# Patient Record
Sex: Female | Born: 1990 | Race: Asian | Hispanic: No | Marital: Married | Smoking: Never smoker
Health system: Southern US, Community
[De-identification: ages and names within clinical notes are randomized; demographics above are authoritative.]

## PROBLEM LIST (undated history)

## (undated) ENCOUNTER — Inpatient Hospital Stay (HOSPITAL_COMMUNITY): Payer: Self-pay

## (undated) DIAGNOSIS — Z789 Other specified health status: Secondary | ICD-10-CM

## (undated) HISTORY — PX: NO PAST SURGERIES: SHX2092

---

## 2017-03-17 DIAGNOSIS — Z0000 Encounter for general adult medical examination without abnormal findings: Secondary | ICD-10-CM | POA: Diagnosis not present

## 2017-03-17 DIAGNOSIS — R718 Other abnormality of red blood cells: Secondary | ICD-10-CM | POA: Diagnosis not present

## 2017-04-18 DIAGNOSIS — N896 Tight hymenal ring: Secondary | ICD-10-CM | POA: Diagnosis not present

## 2017-04-18 DIAGNOSIS — Z01411 Encounter for gynecological examination (general) (routine) with abnormal findings: Secondary | ICD-10-CM | POA: Diagnosis not present

## 2017-04-18 DIAGNOSIS — Z01419 Encounter for gynecological examination (general) (routine) without abnormal findings: Secondary | ICD-10-CM | POA: Diagnosis not present

## 2017-04-18 DIAGNOSIS — Z118 Encounter for screening for other infectious and parasitic diseases: Secondary | ICD-10-CM | POA: Diagnosis not present

## 2017-05-14 DIAGNOSIS — M62838 Other muscle spasm: Secondary | ICD-10-CM | POA: Diagnosis not present

## 2017-05-14 DIAGNOSIS — M6289 Other specified disorders of muscle: Secondary | ICD-10-CM | POA: Diagnosis not present

## 2017-05-14 DIAGNOSIS — M6281 Muscle weakness (generalized): Secondary | ICD-10-CM | POA: Diagnosis not present

## 2017-05-19 DIAGNOSIS — M6281 Muscle weakness (generalized): Secondary | ICD-10-CM | POA: Diagnosis not present

## 2017-05-19 DIAGNOSIS — M6289 Other specified disorders of muscle: Secondary | ICD-10-CM | POA: Diagnosis not present

## 2017-05-19 DIAGNOSIS — M62838 Other muscle spasm: Secondary | ICD-10-CM | POA: Diagnosis not present

## 2017-05-29 DIAGNOSIS — M62838 Other muscle spasm: Secondary | ICD-10-CM | POA: Diagnosis not present

## 2017-05-29 DIAGNOSIS — M6281 Muscle weakness (generalized): Secondary | ICD-10-CM | POA: Diagnosis not present

## 2017-05-29 DIAGNOSIS — M6289 Other specified disorders of muscle: Secondary | ICD-10-CM | POA: Diagnosis not present

## 2017-06-05 DIAGNOSIS — M62838 Other muscle spasm: Secondary | ICD-10-CM | POA: Diagnosis not present

## 2017-06-05 DIAGNOSIS — M6281 Muscle weakness (generalized): Secondary | ICD-10-CM | POA: Diagnosis not present

## 2017-06-19 DIAGNOSIS — M62838 Other muscle spasm: Secondary | ICD-10-CM | POA: Diagnosis not present

## 2017-06-19 DIAGNOSIS — M6289 Other specified disorders of muscle: Secondary | ICD-10-CM | POA: Diagnosis not present

## 2017-06-19 DIAGNOSIS — M6281 Muscle weakness (generalized): Secondary | ICD-10-CM | POA: Diagnosis not present

## 2017-07-30 DIAGNOSIS — M6281 Muscle weakness (generalized): Secondary | ICD-10-CM | POA: Diagnosis not present

## 2017-07-30 DIAGNOSIS — M62838 Other muscle spasm: Secondary | ICD-10-CM | POA: Diagnosis not present

## 2017-07-30 DIAGNOSIS — M6289 Other specified disorders of muscle: Secondary | ICD-10-CM | POA: Diagnosis not present

## 2020-03-21 LAB — OB RESULTS CONSOLE HEPATITIS B SURFACE ANTIGEN: Hepatitis B Surface Ag: NEGATIVE

## 2020-03-21 LAB — OB RESULTS CONSOLE RUBELLA ANTIBODY, IGM: Rubella: IMMUNE

## 2020-03-21 LAB — OB RESULTS CONSOLE HIV ANTIBODY (ROUTINE TESTING): HIV: NONREACTIVE

## 2020-03-21 LAB — OB RESULTS CONSOLE RPR: RPR: NONREACTIVE

## 2020-03-21 LAB — OB RESULTS CONSOLE GC/CHLAMYDIA
Chlamydia: NEGATIVE
Gonorrhea: NEGATIVE

## 2020-04-24 LAB — OB RESULTS CONSOLE ABO/RH: RH Type: POSITIVE

## 2020-06-03 NOTE — L&D Delivery Note (Addendum)
Delivery Note:   G2P0010 at [redacted]w[redacted]d  Admitting diagnosis: Normal labor and delivery [O80] Pregnancy [Z34.90] Risks: hx early loss, cervical cerclage until 35 wks Onset of labor: 5/4 at 1800 IOL/Augmentation: AROM ROM: AROM 5/4 at 2316 in second stage of labor. Particulate meconium noted.   Complete dilation at 10/04/2020  2022 Onset of pushing at 2310 FHR second stage Cat II. Moderate variability, accels present, with recurrent variables with pushing. Nadir of 90's with quick return to baseline. Prolong decel x1; resolved with position changed.    Analgesia /Anesthesia intrapartum:Epidural  Bridget Miller Pushing in lithotomy position with good maternal efforts. NICU requested for mec stained fluid. FHR Cat II noted. Position changed to Right maternal tilt, pushing paused in effort allow fetal HR to recover. FHR quickly returned to baseline. SNM CNM and L&D staff, and NICU support at bedside. 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 Firm perineal pressure applied in attempt to minimize tearing. Infant expelled with following contraction. FOB present for birth and supportive.  Delivery of a Live born female  Birth Weight: 8 lb 1.1 oz (3660 g) APGAR: 9, 9  Newborn Delivery   Birth date/time: 10/04/2020 23:43:00 Delivery type: Vaginal, Spontaneous     Infant delivered in cephalic presentation, position DOA, with nuchal x2 and cross-body cord. With little stimulation, infant began to cry vigorously. Infant placed immediately S2S with mom.    APGAR:1 min-9 , 5 min-9  10 min-  Nuchal Cord: Yes  and a body cord.  Cord double clamped after cessation of pulsation by SNM, cut by FOB.  Collection of cord blood for typing completed. Cord blood donation-None  Arterial cord blood sample-No    Placenta delivered with gentle cord traction and LUS massage intact with 3 vessels . Trailing membranes eased out with ring forceps.  Uterotonics: IV  pitocin  Placenta to L&D for disposal. Uterine tone firm with minimal bleeding.   3rd degree category C laceration identified. MD consult for repair. Repaired by MD. Please see note. Prophylactic IV Ancef for extensive repair involving rectal structures.  Episiotomy:None  Local analgesia: Epidural  Repair: See MD note Est. Blood Loss (mL):700.00   Complications: None   Mom to postpartum.  Baby to Couplet care / Skin to Skin.  Delivery Report:  Review the Delivery Report for details.     Bridget Miller, BSN, RNC-OB  Student Nurse-Midwife   10/05/2020  2:44 AM  Medical screening examination/treatment/procedure(s) were conducted as a shared visit with non-physician practitioner(s) and myself.  I personally evaluated the patient during the encounter.   Neta Mends, CNM, MSN 10/05/2020, 9:47 AM

## 2020-06-12 ENCOUNTER — Other Ambulatory Visit: Payer: Self-pay | Admitting: Obstetrics & Gynecology

## 2020-06-12 ENCOUNTER — Encounter (HOSPITAL_COMMUNITY): Payer: Self-pay | Admitting: Obstetrics & Gynecology

## 2020-06-12 NOTE — Pre-Procedure Instructions (Signed)
Pt instructed to be NPO p MN with verbalized understanding.  Arrival at 0900 due to need for covid test.  All questions answered.

## 2020-06-13 ENCOUNTER — Ambulatory Visit (HOSPITAL_COMMUNITY)
Admission: RE | Admit: 2020-06-13 | Discharge: 2020-06-13 | Disposition: A | Discharge status: Home / Self Care | Payer: 59 | Attending: Obstetrics & Gynecology | Admitting: Obstetrics & Gynecology

## 2020-06-13 ENCOUNTER — Encounter (HOSPITAL_COMMUNITY): Payer: Self-pay | Admitting: Obstetrics & Gynecology

## 2020-06-13 ENCOUNTER — Encounter (HOSPITAL_COMMUNITY)
Admission: RE | Disposition: A | Discharge status: Home / Self Care | Payer: Self-pay | Source: Home / Self Care | Attending: Obstetrics & Gynecology

## 2020-06-13 ENCOUNTER — Ambulatory Visit (HOSPITAL_COMMUNITY): Payer: 59 | Admitting: Anesthesiology

## 2020-06-13 ENCOUNTER — Other Ambulatory Visit: Payer: Self-pay

## 2020-06-13 DIAGNOSIS — Z3A22 22 weeks gestation of pregnancy: Secondary | ICD-10-CM | POA: Diagnosis not present

## 2020-06-13 DIAGNOSIS — Z20822 Contact with and (suspected) exposure to covid-19: Secondary | ICD-10-CM | POA: Insufficient documentation

## 2020-06-13 DIAGNOSIS — O26872 Cervical shortening, second trimester: Secondary | ICD-10-CM | POA: Insufficient documentation

## 2020-06-13 DIAGNOSIS — O3432 Maternal care for cervical incompetence, second trimester: Secondary | ICD-10-CM | POA: Diagnosis present

## 2020-06-13 HISTORY — PX: CERVICAL CERCLAGE: SHX1329

## 2020-06-13 HISTORY — DX: Maternal care for cervical incompetence, second trimester: O34.32

## 2020-06-13 HISTORY — DX: Other specified health status: Z78.9

## 2020-06-13 LAB — RESP PANEL BY RT-PCR (FLU A&B, COVID) ARPGX2
Influenza A by PCR: NEGATIVE
Influenza B by PCR: NEGATIVE
SARS Coronavirus 2 by RT PCR: NEGATIVE

## 2020-06-13 LAB — CBC
HCT: 33.7 % — ABNORMAL LOW (ref 36.0–46.0)
Hemoglobin: 10.9 g/dL — ABNORMAL LOW (ref 12.0–15.0)
MCH: 27.2 pg (ref 26.0–34.0)
MCHC: 32.3 g/dL (ref 30.0–36.0)
MCV: 84 fL (ref 80.0–100.0)
Platelets: 208 10*3/uL (ref 150–400)
RBC: 4.01 MIL/uL (ref 3.87–5.11)
RDW: 13.6 % (ref 11.5–15.5)
WBC: 13.4 10*3/uL — ABNORMAL HIGH (ref 4.0–10.5)
nRBC: 0 % (ref 0.0–0.2)

## 2020-06-13 SURGERY — CERCLAGE, CERVIX, VAGINAL APPROACH
Anesthesia: Spinal

## 2020-06-13 MED ORDER — ONDANSETRON HCL 4 MG/2ML IJ SOLN
INTRAMUSCULAR | Status: AC
  Filled 2020-06-13: qty 2

## 2020-06-13 MED ORDER — EPHEDRINE 5 MG/ML INJ
INTRAVENOUS | Status: AC
  Filled 2020-06-13: qty 10

## 2020-06-13 MED ORDER — POVIDONE-IODINE 10 % EX SWAB: 2.0000 "application " | Freq: Once | CUTANEOUS | Status: DC

## 2020-06-13 MED ORDER — CEFAZOLIN SODIUM-DEXTROSE 2-4 GM/100ML-% IV SOLN
2.0000 g | INTRAVENOUS | Status: AC
  Administered 2020-06-13: 2 g via INTRAVENOUS

## 2020-06-13 MED ORDER — BUPIVACAINE IN DEXTROSE 0.75-8.25 % IT SOLN
INTRATHECAL | Status: DC | PRN
  Administered 2020-06-13: 1.4 mL via INTRATHECAL

## 2020-06-13 MED ORDER — LACTATED RINGERS IV SOLN: INTRAVENOUS | Status: DC

## 2020-06-13 MED ORDER — ONDANSETRON HCL 4 MG/2ML IJ SOLN: 4.0000 mg | Freq: Once | INTRAMUSCULAR | Status: DC | PRN

## 2020-06-13 MED ORDER — INDOMETHACIN 25 MG PO CAPS: 25.0000 mg | ORAL_CAPSULE | Freq: Three times a day (TID) | ORAL | 0 refills | Status: AC

## 2020-06-13 MED ORDER — ONDANSETRON HCL 4 MG/2ML IJ SOLN
INTRAMUSCULAR | Status: DC | PRN
  Administered 2020-06-13: 4 mg via INTRAVENOUS

## 2020-06-13 MED ORDER — CEFAZOLIN SODIUM-DEXTROSE 2-4 GM/100ML-% IV SOLN
INTRAVENOUS | Status: AC
  Filled 2020-06-13: qty 100

## 2020-06-13 MED ORDER — FENTANYL CITRATE (PF) 100 MCG/2ML IJ SOLN: 25.0000 ug | INTRAMUSCULAR | Status: DC | PRN

## 2020-06-13 MED ORDER — EPHEDRINE SULFATE-NACL 50-0.9 MG/10ML-% IV SOSY
PREFILLED_SYRINGE | INTRAVENOUS | Status: DC | PRN
  Administered 2020-06-13 (×2): 5 mg via INTRAVENOUS

## 2020-06-13 SURGICAL SUPPLY — 16 items
CANISTER SUCT 3000ML PPV (MISCELLANEOUS) ×2 IMPLANT
GLOVE BIO SURGEON STRL SZ7 (GLOVE) ×2 IMPLANT
GLOVE BIOGEL PI IND STRL 7.0 (GLOVE) ×1 IMPLANT
GLOVE BIOGEL PI INDICATOR 7.0 (GLOVE) ×1
GOWN STRL REUS W/TWL LRG LVL3 (GOWN DISPOSABLE) ×4 IMPLANT
NEEDLE MAYO CATGUT SZ4 (NEEDLE) IMPLANT
NS IRRIG 1000ML POUR BTL (IV SOLUTION) ×2 IMPLANT
PACK VAGINAL MINOR WOMEN LF (CUSTOM PROCEDURE TRAY) ×2 IMPLANT
PAD OB MATERNITY 4.3X12.25 (PERSONAL CARE ITEMS) ×2 IMPLANT
PAD PREP 24X48 CUFFED NSTRL (MISCELLANEOUS) ×2 IMPLANT
SUT PROLENE 1 CT 1 30 (SUTURE) ×2 IMPLANT
SYR BULB IRRIGATION 50ML (SYRINGE) ×2 IMPLANT
TOWEL OR 17X24 6PK STRL BLUE (TOWEL DISPOSABLE) ×4 IMPLANT
TRAY FOLEY W/BAG SLVR 14FR (SET/KITS/TRAYS/PACK) ×2 IMPLANT
TUBING NON-CON 1/4 X 20 CONN (TUBING) ×2 IMPLANT
YANKAUER SUCT BULB TIP NO VENT (SUCTIONS) ×2 IMPLANT

## 2020-06-13 NOTE — Transfer of Care (Signed)
Immediate Anesthesia Transfer of Care Note  Patient: Bridget Miller  Procedure(s) Performed: Emergency McDonald CERVICAL CERCLAGE (N/A )  Patient Location: PACU  Anesthesia Type:Spinal  Level of Consciousness: awake, alert  and oriented  Airway & Oxygen Therapy: Patient Spontanous Breathing  Post-op Assessment: Report given to RN and Post -op Vital signs reviewed and stable  Post vital signs: Reviewed and stable  Last Vitals:  Vitals Value Taken Time  BP 113/54 06/13/20 1230  Temp    Pulse 76 06/13/20 1233  Resp 24 06/13/20 1233  SpO2 100 % 06/13/20 1233  Vitals shown include unvalidated device data.  Last Pain: There were no vitals filed for this visit.       Complications: No complications documented.

## 2020-06-13 NOTE — Anesthesia Postprocedure Evaluation (Signed)
Anesthesia Post Note  Patient: Bridget Miller  Procedure(s) Performed: Emergency McDonald CERVICAL CERCLAGE (N/A )     Patient location during evaluation: PACU Anesthesia Type: Spinal Level of consciousness: oriented and awake and alert Pain management: pain level controlled Vital Signs Assessment: post-procedure vital signs reviewed and stable Respiratory status: spontaneous breathing and respiratory function stable Cardiovascular status: blood pressure returned to baseline and stable Postop Assessment: no headache, no backache, no apparent nausea or vomiting and spinal receding Anesthetic complications: no   No complications documented.  Last Vitals:  Vitals:   06/13/20 1300 06/13/20 1315  BP: (!) 118/54 112/67  Pulse: 79 87  Resp: 20 (!) 22  Temp:    SpO2: 100% 100%    Last Pain:  Vitals:   06/13/20 1230  TempSrc: Oral   Pain Goal:    LLE Motor Response: Non-purposeful movement (06/13/20 1245)   RLE Motor Response: Non-purposeful movement (06/13/20 1245)       Epidural/Spinal Function Cutaneous sensation: Able to Discern Pressure (06/13/20 1315), Patient able to flex knees: Yes (06/13/20 1315), Patient able to lift hips off bed: No (06/13/20 1315), Back pain beyond tenderness at insertion site: No (06/13/20 1315), Progressively worsening motor and/or sensory loss: No (06/13/20 1315), Bowel and/or bladder incontinence post epidural: No (06/13/20 1315)  Mellody Dance

## 2020-06-13 NOTE — H&P (Addendum)
Bridget Miller is a 30 y.o. female presenting for emergency McDonald's cervical cerclage at 22.4 wks for acute cervical shortening. G2P0 one early 1st trim SAB. 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  Uncomplicated pregnancy,nl PNLabs, nl Ultrascreen and AFP1. Cervical length was 2.7 cm at 19 wks anatomy sono, repeat at 21 wks was down to 2.1 cm with beaking at internal os. She was started on vaginal Prometrium 200 mcg after d/w MFM. Repeat CL in 10 days yesterday at 22.3 wks was down to 1.4 cm with 0.6 cm funneling. External os was closed. Denies contractions/ vag bleeding/ pelvic pressure   Patient was counseled on continuing vag Prometrium (good evidence) or also add  emergency cerclage (good evidence for <1 cm)- risks/ complications d/w pt and decision was made to proceed with Emergency Cerclage ASAP.   OB History    Gravida  2   Para      Term      Preterm      AB  1   Living        SAB  1   IAB      Ectopic      Multiple      Live Births             Past Medical History:  Diagnosis Date  . Medical history non-contributory    Family History: family history is not on file. Social History:  reports that she has never smoked. She has never used smokeless tobacco. She reports that she does not drink alcohol and does not use drugs.  Review of Systems no UTIs novag dc or odor or itching. No LOF.  History   Height 5\' 5"  (1.651 m), weight 70.3 kg. Exam Physical Exam  Physical exam:  A&O x 3, no acute distress. Pleasant HEENT neg, no thyromegaly Lungs CTA bilat CV RRR, S1S2 normal Abdo soft, non tender, non acute Extr no edema/ tenderness Pelvic Ext os closed, internal os open per office sono yesterday. CL 1.4 cm per office sono FHT  150s Toco none   Assessment/Plan: 30 yo G2P0 at 22.4 wks here for emergency cervical cerclage due to CL 1.4 cm and funneling, down from 2.7 cm 3 weeks back.  Risks/complications of  surgery reviewed incl infection, bleeding, rupture membranes, infection, damage to internal organs including bladder, bowels, ureters, blood vessels, other risks from anesthesia, VTE and delayed complications of any surgery, complications in future surgery reviewed. Pt gives informed written consent   26 06/13/2020, 7:15 AM  Patient seen in PACU,.agree with above note and plan V.Levern Pitter ,MD

## 2020-06-13 NOTE — Progress Notes (Signed)
FHT's dopplered 170

## 2020-06-13 NOTE — Anesthesia Preprocedure Evaluation (Signed)
Anesthesia Evaluation  Patient identified by MRN, date of birth, ID band Patient awake    Reviewed: Allergy & Precautions, H&P , NPO status , Patient's Chart, lab work & pertinent test results  Airway Mallampati: II  TM Distance: >3 FB Neck ROM: Full    Dental no notable dental hx.    Pulmonary neg pulmonary ROS,    Pulmonary exam normal breath sounds clear to auscultation       Cardiovascular negative cardio ROS Normal cardiovascular exam Rhythm:Regular Rate:Normal     Neuro/Psych negative neurological ROS  negative psych ROS   GI/Hepatic negative GI ROS, Neg liver ROS,   Endo/Other  negative endocrine ROS  Renal/GU negative Renal ROS  negative genitourinary   Musculoskeletal negative musculoskeletal ROS (+)   Abdominal   Peds negative pediatric ROS (+)  Hematology negative hematology ROS (+)   Anesthesia Other Findings   Reproductive/Obstetrics negative OB ROS                             Anesthesia Physical Anesthesia Plan  ASA: II  Anesthesia Plan: Spinal   Post-op Pain Management:    Induction:   PONV Risk Score and Plan: 2  Airway Management Planned: Natural Airway  Additional Equipment:   Intra-op Plan:   Post-operative Plan:   Informed Consent: I have reviewed the patients History and Physical, chart, labs and discussed the procedure including the risks, benefits and alternatives for the proposed anesthesia with the patient or authorized representative who has indicated his/her understanding and acceptance.       Plan Discussed with: CRNA and Anesthesiologist  Anesthesia Plan Comments:         Anesthesia Quick Evaluation

## 2020-06-13 NOTE — Progress Notes (Signed)
Pt able to walk independently.  IV D/C.  Foley D/C.  Reviewed D/C instructions with pt.  Copy of instructions given to pt.  Appt with Dr Juliene Pina in one week for follow up.

## 2020-06-13 NOTE — Discharge Instructions (Signed)
Patient needs to take Indomethacin every 8 hours with food for 2 days to reduce risk of uterine contractions/

## 2020-06-13 NOTE — Anesthesia Procedure Notes (Signed)
Spinal  Patient location during procedure: OB Start time: 06/13/2020 11:35 AM End time: 06/13/2020 11:40 AM Staffing Performed: anesthesiologist  Anesthesiologist: Mellody Dance, MD Preanesthetic Checklist Completed: patient identified, IV checked, risks and benefits discussed, surgical consent, monitors and equipment checked, pre-op evaluation and timeout performed Spinal Block Patient position: sitting Prep: DuraPrep Patient monitoring: cardiac monitor, continuous pulse ox and blood pressure Approach: midline Location: L3-4 Injection technique: single-shot Needle Needle type: Pencan  Needle gauge: 24 G Needle length: 9 cm Additional Notes Functioning IV was confirmed and monitors were applied. Sterile prep and drape, including hand hygiene and sterile gloves were used. The patient was positioned and the spine was prepped. The skin was anesthetized with lidocaine.  Free flow of clear CSF was obtained prior to injecting local anesthetic into the CSF.  The spinal needle aspirated freely following injection.  The needle was carefully withdrawn.  The patient tolerated the procedure well.

## 2020-06-13 NOTE — Op Note (Signed)
06/13/2020 Pre-op diagnosis: Cervical shortening at 22.4 weeks  Post-op diagnosis: Same  Procedure: Emergency McDonald's cervical cerclage  Surgeon: Shea Evans, MD  Assistant: Arlan Organ, CNM  IVFluids: 500 cc LR Urine output: 100 cc in foley EBL 10 cc  Pathology: none Disposition: PACU   Procedure:  Indication: 30 yo, G2P0, with one 1st trimester miscarriage and no prior history of cervical surgery/ incompetence. Here due to acute cervical shortening from  2.7 cm at 19 weeks to 1.4 cm at 22.3 wks despite using vaginal prometrium 200 mcg vaginally since 10 days. After counseling she gave informed consent for Emergency cervical cerclage.   Informed written consent was obtained after reviewing risks/ complications and future risk of infection/ miscarriage/ PROM and PTD. Understands and agrees.  Patient was brought to the operating room with IV running. She received preop 2 gm Ancef. She underwent Spinal anesthesia without complications. She was given dorsolithotomy position. Parts were prepped and draped in standard fashion. Bladder was catheterized with foley. Speculum was placed and cervix was evaluated. External os appeared normal and closed and cervix appeared short (2 cm length).  Bladder reflection noted at cervicovaginal junction. McDonald cerclage performed using Prolene 0 and starting at 1 o'clock and going in anti-clock direction taking regular purse string stitch while grasping cervix with ring forceps. Knot tied at 1 o'clock and a loop created of same stitch for grasping. Bleeding from needle entry- exit sites eventually stopped after stitch was tied down.  Hemostasis excellent. Instruments removed. All counts correct x2.  Patient will be discharged home today.Plan Indocin 25 mg QID for 2 days. F/up sono for CL in office in 1 week. Warning signs of infection and excessive bleeding and miscarriage precautions reviewed.   V.Keedan Sample, MD.

## 2020-07-26 LAB — OB RESULTS CONSOLE RPR: RPR: NONREACTIVE

## 2020-10-04 ENCOUNTER — Inpatient Hospital Stay (HOSPITAL_COMMUNITY)
Admission: AD | Admit: 2020-10-04 | Discharge: 2020-10-06 | DRG: 768 | Disposition: A | Discharge status: Home / Self Care | Payer: 59 | Attending: Obstetrics and Gynecology | Admitting: Obstetrics and Gynecology

## 2020-10-04 ENCOUNTER — Other Ambulatory Visit: Payer: Self-pay

## 2020-10-04 ENCOUNTER — Encounter (HOSPITAL_COMMUNITY): Payer: Self-pay | Admitting: Obstetrics and Gynecology

## 2020-10-04 ENCOUNTER — Inpatient Hospital Stay (HOSPITAL_COMMUNITY): Payer: 59 | Admitting: Anesthesiology

## 2020-10-04 DIAGNOSIS — O9081 Anemia of the puerperium: Secondary | ICD-10-CM | POA: Diagnosis not present

## 2020-10-04 DIAGNOSIS — D62 Acute posthemorrhagic anemia: Secondary | ICD-10-CM | POA: Diagnosis not present

## 2020-10-04 DIAGNOSIS — Z3A38 38 weeks gestation of pregnancy: Secondary | ICD-10-CM

## 2020-10-04 DIAGNOSIS — O26873 Cervical shortening, third trimester: Secondary | ICD-10-CM | POA: Diagnosis present

## 2020-10-04 DIAGNOSIS — Z20822 Contact with and (suspected) exposure to covid-19: Secondary | ICD-10-CM | POA: Diagnosis present

## 2020-10-04 DIAGNOSIS — O9902 Anemia complicating childbirth: Secondary | ICD-10-CM | POA: Diagnosis not present

## 2020-10-04 DIAGNOSIS — O3432 Maternal care for cervical incompetence, second trimester: Secondary | ICD-10-CM

## 2020-10-04 DIAGNOSIS — Z3490 Encounter for supervision of normal pregnancy, unspecified, unspecified trimester: Secondary | ICD-10-CM

## 2020-10-04 DIAGNOSIS — O26893 Other specified pregnancy related conditions, third trimester: Secondary | ICD-10-CM | POA: Diagnosis present

## 2020-10-04 LAB — CBC
HCT: 38.9 % (ref 36.0–46.0)
Hemoglobin: 12.9 g/dL (ref 12.0–15.0)
MCH: 28.4 pg (ref 26.0–34.0)
MCHC: 33.2 g/dL (ref 30.0–36.0)
MCV: 85.5 fL (ref 80.0–100.0)
Platelets: 201 10*3/uL (ref 150–400)
RBC: 4.55 MIL/uL (ref 3.87–5.11)
RDW: 12.8 % (ref 11.5–15.5)
WBC: 13.7 10*3/uL — ABNORMAL HIGH (ref 4.0–10.5)
nRBC: 0 % (ref 0.0–0.2)

## 2020-10-04 LAB — TYPE AND SCREEN
ABO/RH(D): O POS
Antibody Screen: NEGATIVE

## 2020-10-04 LAB — RESP PANEL BY RT-PCR (FLU A&B, COVID) ARPGX2
Influenza A by PCR: NEGATIVE
Influenza B by PCR: NEGATIVE
SARS Coronavirus 2 by RT PCR: NEGATIVE

## 2020-10-04 MED ORDER — EPHEDRINE 5 MG/ML INJ: 10.0000 mg | INTRAVENOUS | Status: DC | PRN

## 2020-10-04 MED ORDER — LACTATED RINGERS IV SOLN: 500.0000 mL | INTRAVENOUS | Status: DC | PRN

## 2020-10-04 MED ORDER — LACTATED RINGERS IV SOLN: INTRAVENOUS | Status: DC

## 2020-10-04 MED ORDER — DIPHENHYDRAMINE HCL 50 MG/ML IJ SOLN: 12.5000 mg | INTRAMUSCULAR | Status: DC | PRN

## 2020-10-04 MED ORDER — ONDANSETRON HCL 4 MG/2ML IJ SOLN: 4.0000 mg | Freq: Four times a day (QID) | INTRAMUSCULAR | Status: DC | PRN

## 2020-10-04 MED ORDER — FENTANYL-BUPIVACAINE-NACL 0.5-0.125-0.9 MG/250ML-% EP SOLN
12.0000 mL/h | EPIDURAL | Status: DC | PRN
  Administered 2020-10-04: 12 mL/h via EPIDURAL
  Filled 2020-10-04: qty 250

## 2020-10-04 MED ORDER — SOD CITRATE-CITRIC ACID 500-334 MG/5ML PO SOLN: 30.0000 mL | ORAL | Status: DC | PRN

## 2020-10-04 MED ORDER — OXYCODONE-ACETAMINOPHEN 5-325 MG PO TABS
1.0000 | ORAL_TABLET | ORAL | Status: DC | PRN
Start: 2020-10-04 — End: 2020-10-05

## 2020-10-04 MED ORDER — OXYCODONE-ACETAMINOPHEN 5-325 MG PO TABS: 2.0000 | ORAL_TABLET | ORAL | Status: DC | PRN

## 2020-10-04 MED ORDER — PRENATAL MULTIVITAMIN CH: ORAL_TABLET | Freq: Every evening | ORAL | Status: DC

## 2020-10-04 MED ORDER — FLEET ENEMA 7-19 GM/118ML RE ENEM: 1.0000 | ENEMA | RECTAL | Status: DC | PRN

## 2020-10-04 MED ORDER — PHENYLEPHRINE 40 MCG/ML (10ML) SYRINGE FOR IV PUSH (FOR BLOOD PRESSURE SUPPORT)
80.0000 ug | PREFILLED_SYRINGE | INTRAVENOUS | Status: DC | PRN
  Administered 2020-10-05: 80 ug via INTRAVENOUS

## 2020-10-04 MED ORDER — LIDOCAINE HCL (PF) 1 % IJ SOLN
INTRAMUSCULAR | Status: DC | PRN
  Administered 2020-10-04: 10 mL via EPIDURAL

## 2020-10-04 MED ORDER — ACETAMINOPHEN 325 MG PO TABS: 650.0000 mg | ORAL_TABLET | ORAL | Status: DC | PRN

## 2020-10-04 MED ORDER — OXYTOCIN-SODIUM CHLORIDE 30-0.9 UT/500ML-% IV SOLN
2.5000 [IU]/h | INTRAVENOUS | Status: DC
  Filled 2020-10-04: qty 500

## 2020-10-04 MED ORDER — LIDOCAINE HCL (PF) 1 % IJ SOLN: 30.0000 mL | INTRAMUSCULAR | Status: DC | PRN

## 2020-10-04 MED ORDER — OXYTOCIN BOLUS FROM INFUSION
333.0000 mL | Freq: Once | INTRAVENOUS | Status: AC
  Administered 2020-10-04: 333 mL via INTRAVENOUS

## 2020-10-04 MED ORDER — FENTANYL CITRATE (PF) 100 MCG/2ML IJ SOLN
100.0000 ug | Freq: Once | INTRAMUSCULAR | Status: AC
  Administered 2020-10-04: 100 ug via INTRAVENOUS
  Filled 2020-10-04: qty 2

## 2020-10-04 MED ORDER — OXYTOCIN-SODIUM CHLORIDE 30-0.9 UT/500ML-% IV SOLN: 2.5000 [IU]/h | INTRAVENOUS | Status: DC

## 2020-10-04 MED ORDER — LACTATED RINGERS IV SOLN
500.0000 mL | INTRAVENOUS | Status: DC | PRN
  Administered 2020-10-05: 500 mL via INTRAVENOUS

## 2020-10-04 MED ORDER — LACTATED RINGERS IV SOLN: 500.0000 mL | Freq: Once | INTRAVENOUS | Status: DC

## 2020-10-04 MED ORDER — PHENYLEPHRINE 40 MCG/ML (10ML) SYRINGE FOR IV PUSH (FOR BLOOD PRESSURE SUPPORT)
80.0000 ug | PREFILLED_SYRINGE | INTRAVENOUS | Status: DC | PRN
  Filled 2020-10-04: qty 10

## 2020-10-04 NOTE — H&P (Addendum)
OB ADMISSION/ HISTORY & PHYSICAL:  Admission Date: 10/04/2020  6:10 PM  Admit Diagnosis: Normal labor and delivery [O80] Pregnancy [Z34.90]    Bridget Miller is a 30 y.o. female presenting to [facility] for spontaneous labor.  Prenatal History: G2P0010   EDC : 10/12/2020, by Other Basis  Prenatal care at [facility] since 10 weeks 5 days.   Prenatal course complicated by: Short cervical length in pregnancy  Anemia   Prenatal Labs: ABO, Rh:   O Positive  Antibody: PENDING (05/04 1830) Rubella:   Immune  RPR:   Non-Reactive  HBsAg:  Non- Reactive  HIV:   Negative  GBS:   Negative  1 hr Glucola : 105gtts Normal Genetic Screening: Normal  Ultrasound: Normal, AC 54%tile. XY.   Vaccines: TDaP          UTD          Flu             UTD                    COVID-19 UTD     Maternal Diabetes: No Genetic Screening: Normal Maternal Ultrasounds/Referrals: Normal Fetal Ultrasounds or other Referrals:  None Maternal Substance Abuse:  No Significant Maternal Medications:  None Significant Maternal Lab Results:  Group B Strep negative Other Comments:  None  Medical / Surgical History :  Past medical history:  Past Medical History:  Diagnosis Date   Incompetent cervix during second trimester, antepartum 06/13/2020   Medical history non-contributory      Past surgical history:  Past Surgical History:  Procedure Laterality Date   CERVICAL CERCLAGE N/A 06/13/2020   Procedure: Emergency McDonald CERVICAL CERCLAGE;  Surgeon: Shea Evans, MD;  Location: [facility] LD ORS;  Service: Gynecology;  Laterality: N/A;  EDD: 10/12/20   NO PAST SURGERIES       Family History: No family history on file.   Social History:  reports that she has never smoked. She has never used smokeless tobacco. She reports that she does not drink alcohol and does not use drugs.   Allergies: Patient has no known allergies.   Current Medications at time of admission:  Medications Prior to Admission  Medication  Sig Dispense Refill Last Dose   ferrous sulfate 325 (65 FE) MG tablet Take 325 mg by mouth 4 (four) times a week.      Prenatal Vit-Fe Fumarate-FA (PRENATAL PO) Take 1 tablet by mouth every evening.      progesterone 200 MG SUPP Place 200 mg vaginally at bedtime.        Review of Systems: Review of Systems  Constitutional: Negative for chills, fever and weight loss.  HENT: Negative for sore throat.   Eyes: Negative for blurred vision and double vision.  Respiratory: Negative for cough, shortness of breath, wheezing and stridor.   Cardiovascular: Negative for chest pain and palpitations.  Gastrointestinal: Negative for nausea and vomiting.  Musculoskeletal: Negative for falls.  Neurological: Negative for dizziness, tingling, seizures, loss of consciousness, weakness and headaches.  Psychiatric/Behavioral: Negative for depression, hallucinations, substance abuse and suicidal ideas. The patient is not nervous/anxious.     Physical Exam: Vital signs and nursing notes reviewed.  Patient Vitals for the past 24 hrs:  BP Temp Temp src Pulse Resp Height  10/04/20 1850 113/63 -- -- 70 -- --  10/04/20 1849 -- 97.9 F (36.6 C) Oral -- 16 --  10/04/20 1844 -- -- -- -- -- 5\' 5"  (1.651 m)  10/04/20 1825  119/73 98.4 F (36.9 C) Oral 73 15 --     General: AAO x 3, NAD, not coping well with contractions.  Heart: RRR Lungs:CTAB Abdomen: Gravid, NT, Leopold's Vertex  Extremities: mild edema Genitalia / VE: Per MAU exam; Dilation: 6 Effacement (%): 90 Cervical Position: Middle Station: -2 Presentation: Vertex Exam by:: Holly Flippin RN   SVE deferred. Will re-assess post epidural placement.   FHR: 150 BPM, moderate variability, present accels, absent decels TOCO: Ctx q2-4 mins, moderate to palpation   Labs:   Pending T&S, CBC, RPR  Recent Labs    10/04/20 1840  WBC 13.7*  HGB 12.9  HCT 38.9  PLT 201     Assessment:  30 y.o. G2P0010 at [redacted]w[redacted]d Anemia in pregnancy-Stable on 65mg   oral Iron  Short cervix in pregnancy-200mg  Promethium up to 36 weeks. Emergency cerclage removed at 36 weeks.  1. Active stage of labor 2. FHR category I 3. GBS negative 4. Desires epidural.  5. Breastfeeding 6. Placenta disposal L&D  Plan:  1. Admit to BS 2. Routine L&D orders 3. Analgesia/anesthesia PRN. Pt may have epidural upon request. 100mg  IV fentanyl x1 dose while awaiting labs for epidural.  4. Active management- Plan for AROM post epidural placement.  5. Cautious for NSVB; S>D @ 38wks.    Expecting baby boy "Mahan"  Dr notified of admission / plan of care   Shantonette ) Billy Coast, BSN, RNC-OB  Student Nurse-Midwife   10/04/2020  7:53 PM  Medical screening examination/treatment/procedure(s) were conducted as a shared visit with non-physician practitioner(s) and myself.  I personally evaluated the patient during the encounter.   Suzie Portela, CNM, MSN 10/05/2020, 1:35 AM

## 2020-10-04 NOTE — Anesthesia Procedure Notes (Signed)
Epidural Patient location during procedure: OB Start time: 10/04/2020 7:28 PM End time: 10/04/2020 7:39 PM  Staffing Anesthesiologist: Lucretia Kern, MD Performed: anesthesiologist   Preanesthetic Checklist Completed: patient identified, IV checked, risks and benefits discussed, monitors and equipment checked, pre-op evaluation and timeout performed  Epidural Patient position: sitting Prep: DuraPrep Patient monitoring: heart rate, continuous pulse ox and blood pressure Approach: midline Location: L3-L4 Injection technique: LOR air  Needle:  Needle type: Tuohy  Needle gauge: 17 G Needle length: 9 cm Needle insertion depth: 6 cm Catheter type: closed end flexible Catheter size: 19 Gauge Catheter at skin depth: 11 cm Test dose: negative  Assessment Events: blood not aspirated, injection not painful, no injection resistance, no paresthesia and negative IV test  Additional Notes Reason for block:procedure for pain

## 2020-10-04 NOTE — Progress Notes (Addendum)
Bridget Miller is a 30 y.o. G2P0010 at [redacted]w[redacted]d by ultrasound admitted for labor; Progressing normally without intervention.   Subjective: Bridget Miller comfortable with epidural. Not feeling any pressure.   Objective: Vitals:   10/04/20 2001 10/04/20 2006 10/04/20 2011 10/04/20 2031  BP: 103/74 99/69 105/66 99/65  Pulse: 79 82 83 74  Resp: 16     Temp: 97.6 F (36.4 C)     TempSrc: Oral     SpO2:      Weight:      Height:        No intake/output data recorded. No intake/output data recorded.   FHT:  FHR: 125 bpm, variability: moderate,  accelerations:  Present,  decelerations:  Absent UC:   regular, every 2-3 minutes, moderate to palpation. SVE:   Dilation: 10 Effacement (%): 90 Station: -1 Exam by:: Bridget Miller SNM ROT Labs:   Recent Labs    10/04/20 1840  WBC 13.7*  HGB 12.9  HCT 38.9  PLT 201    Assessment / Plan: G2P0010 30 y.o. [redacted]w[redacted]d Spontaneous labor, progressing normally  Labor: Progressing normally without intervention. Frequent position changes for fetal descent. Plan for AROM and begin pushing.  Preeclampsia:  no signs or symptoms of toxicity Fetal Wellbeing:  Category I Pain Control:  Epidural I/D:  GBS negative Anticipated MOD:  Cautious for SVD d/t S>D.   Bridget Miller) Bridget Miller, BSN, RNC-OB  Student Nurse-Midwife   10/04/2020  9:07 PM

## 2020-10-04 NOTE — Anesthesia Preprocedure Evaluation (Signed)

## 2020-10-05 ENCOUNTER — Encounter (HOSPITAL_COMMUNITY): Payer: Self-pay | Admitting: Obstetrics and Gynecology

## 2020-10-05 DIAGNOSIS — O9902 Anemia complicating childbirth: Secondary | ICD-10-CM | POA: Diagnosis not present

## 2020-10-05 LAB — CBC
HCT: 29.4 % — ABNORMAL LOW (ref 36.0–46.0)
Hemoglobin: 9.8 g/dL — ABNORMAL LOW (ref 12.0–15.0)
MCH: 28.6 pg (ref 26.0–34.0)
MCHC: 33.3 g/dL (ref 30.0–36.0)
MCV: 85.7 fL (ref 80.0–100.0)
Platelets: 147 10*3/uL — ABNORMAL LOW (ref 150–400)
RBC: 3.43 MIL/uL — ABNORMAL LOW (ref 3.87–5.11)
RDW: 12.7 % (ref 11.5–15.5)
WBC: 17.1 10*3/uL — ABNORMAL HIGH (ref 4.0–10.5)
nRBC: 0 % (ref 0.0–0.2)

## 2020-10-05 LAB — MAGNESIUM: Magnesium: 1.9 mg/dL (ref 1.7–2.4)

## 2020-10-05 LAB — RPR: RPR Ser Ql: NONREACTIVE

## 2020-10-05 MED ORDER — CEFAZOLIN SODIUM-DEXTROSE 2-4 GM/100ML-% IV SOLN
2.0000 g | Freq: Once | INTRAVENOUS | Status: AC
  Administered 2020-10-05: 2 g via INTRAVENOUS
  Filled 2020-10-05: qty 100

## 2020-10-05 MED ORDER — ZOLPIDEM TARTRATE 5 MG PO TABS: 5.0000 mg | ORAL_TABLET | Freq: Every evening | ORAL | Status: DC | PRN

## 2020-10-05 MED ORDER — DIPHENHYDRAMINE HCL 25 MG PO CAPS: 25.0000 mg | ORAL_CAPSULE | Freq: Four times a day (QID) | ORAL | Status: DC | PRN

## 2020-10-05 MED ORDER — IBUPROFEN 600 MG PO TABS
600.0000 mg | ORAL_TABLET | Freq: Four times a day (QID) | ORAL | Status: DC
  Administered 2020-10-05 – 2020-10-06 (×6): 600 mg via ORAL
  Filled 2020-10-05 (×6): qty 1

## 2020-10-05 MED ORDER — TETANUS-DIPHTH-ACELL PERTUSSIS 5-2.5-18.5 LF-MCG/0.5 IM SUSY: 0.5000 mL | PREFILLED_SYRINGE | Freq: Once | INTRAMUSCULAR | Status: DC

## 2020-10-05 MED ORDER — ONDANSETRON HCL 4 MG PO TABS: 4.0000 mg | ORAL_TABLET | ORAL | Status: DC | PRN

## 2020-10-05 MED ORDER — DOCUSATE SODIUM 100 MG PO CAPS
100.0000 mg | ORAL_CAPSULE | Freq: Two times a day (BID) | ORAL | Status: DC
  Administered 2020-10-05 – 2020-10-06 (×2): 100 mg via ORAL
  Filled 2020-10-05 (×2): qty 1

## 2020-10-05 MED ORDER — WITCH HAZEL-GLYCERIN EX PADS: 1.0000 "application " | MEDICATED_PAD | CUTANEOUS | Status: DC | PRN

## 2020-10-05 MED ORDER — ACETAMINOPHEN 325 MG PO TABS
650.0000 mg | ORAL_TABLET | ORAL | Status: DC | PRN
  Administered 2020-10-05 (×3): 650 mg via ORAL
  Filled 2020-10-05 (×4): qty 2

## 2020-10-05 MED ORDER — DIBUCAINE (PERIANAL) 1 % EX OINT: 1.0000 "application " | TOPICAL_OINTMENT | CUTANEOUS | Status: DC | PRN

## 2020-10-05 MED ORDER — SENNOSIDES-DOCUSATE SODIUM 8.6-50 MG PO TABS
2.0000 | ORAL_TABLET | Freq: Every day | ORAL | Status: DC
  Administered 2020-10-05: 2 via ORAL
  Filled 2020-10-05: qty 2

## 2020-10-05 MED ORDER — BENZOCAINE-MENTHOL 20-0.5 % EX AERO
1.0000 "application " | INHALATION_SPRAY | CUTANEOUS | Status: DC | PRN
  Administered 2020-10-06: 1 via TOPICAL
  Filled 2020-10-05 (×2): qty 56

## 2020-10-05 MED ORDER — COCONUT OIL OIL
1.0000 | TOPICAL_OIL | Status: DC | PRN
Start: 2020-10-05 — End: 2020-10-06

## 2020-10-05 MED ORDER — SENNOSIDES-DOCUSATE SODIUM 8.6-50 MG PO TABS: 2.0000 | ORAL_TABLET | Freq: Every day | ORAL | Status: DC

## 2020-10-05 MED ORDER — DOCUSATE SODIUM 100 MG PO CAPS: 100.0000 mg | ORAL_CAPSULE | Freq: Two times a day (BID) | ORAL | Status: DC

## 2020-10-05 MED ORDER — POLYSACCHARIDE IRON COMPLEX 150 MG PO CAPS
150.0000 mg | ORAL_CAPSULE | Freq: Every day | ORAL | Status: DC
  Administered 2020-10-05 – 2020-10-06 (×2): 150 mg via ORAL
  Filled 2020-10-05 (×2): qty 1

## 2020-10-05 MED ORDER — ONDANSETRON HCL 4 MG/2ML IJ SOLN: 4.0000 mg | INTRAMUSCULAR | Status: DC | PRN

## 2020-10-05 MED ORDER — PRENATAL MULTIVITAMIN CH
1.0000 | ORAL_TABLET | Freq: Every day | ORAL | Status: DC
  Administered 2020-10-05 – 2020-10-06 (×2): 1 via ORAL
  Filled 2020-10-05 (×2): qty 1

## 2020-10-05 MED ORDER — SIMETHICONE 80 MG PO CHEW: 80.0000 mg | CHEWABLE_TABLET | ORAL | Status: DC | PRN

## 2020-10-05 NOTE — Lactation Note (Signed)
This note was copied from a baby's chart. Lactation Consultation Note  Patient Name: Bridget Miller JOINO'M Date: 10/05/2020   Age:29 hours LC talked with RN on L&D, mom has a  4th degree tear and will take time to repair.  Mom be seen on MBU for latch assessment unless things change.  Maternal Data    Feeding    LATCH Score                    Lactation Tools Discussed/Used    Interventions    Discharge    Consult Status      Danelle Earthly 10/05/2020, 12:15 AM

## 2020-10-05 NOTE — Lactation Note (Signed)
This note was copied from a baby's chart. Lactation Consultation Note  Patient Name: Bridget Miller [id number] Date: 10/05/2020 Reason for consult: Initial assessment;Primapara;1st time breastfeeding;Early term 37-38.6wks Age:29 hours/ baby awake when [facility] and RN shadow entered the room.  Baby was noted be stuffy. Checked the diaper and it was dry ( 1 wet and 2 stool ).  LC reviewed basics steps for latching and mom receptive.  Baby fed 10 mins , see below - Latch score 9 .  Stuffiness improved with feeding.  LC provided the [facility] brochure.   Maternal Data Has patient been taught Hand Expression?: Yes Does the patient have breastfeeding experience prior to this delivery?: No  Feeding Mother's Current Feeding Choice: Breast Milk and Formula  LATCH Score ( assessed by the RN and LC )  Latch: Grasps breast easily, tongue down, lips flanged, rhythmical sucking.  Audible Swallowing: Spontaneous and intermittent  Type of Nipple: Everted at rest and after stimulation  Comfort (Breast/Nipple): Soft / non-tender (areola edema noted, short shaft nipple)  Hold (Positioning): Assistance needed to correctly position infant at breast and maintain latch.  LATCH Score: 9   Lactation Tools Discussed/Used Tools: Shells;Pump;Flanges;Coconut oil (Coconut oil given early RN) Flange Size: 24 Breast pump type: Manual Pump Education: Setup, frequency, and cleaning Reason for Pumping: LC recommended due to short shaft nipples and areola edema - breast massage , hand express, pre pump to stretch the nipple / areola complex  Interventions Interventions: Assisted with latch;Skin to skin;Hand express;Expressed milk;Shells;Hand pump;Education;Adjust position;Breast feeding basics reviewed;Breast massage;Breast compression;Support pillows;Position options;Coconut oil  Discharge Pump: Personal;Manual WIC Program: No  Consult Status Consult Status: Follow-up Date: 10/06/20 Follow-up type:  In-patient    Bridget Miller 10/05/2020, 9:09 AM

## 2020-10-05 NOTE — Anesthesia Postprocedure Evaluation (Signed)
Anesthesia Post Note  Patient: Bridget Miller  Procedure(s) Performed: AN AD HOC LABOR EPIDURAL     Patient location during evaluation: Mother Baby Anesthesia Type: Epidural Level of consciousness: awake and alert and oriented Pain management: satisfactory to patient Vital Signs Assessment: post-procedure vital signs reviewed and stable Respiratory status: respiratory function stable Cardiovascular status: stable Postop Assessment: no headache, no backache, epidural receding, patient able to bend at knees, no signs of nausea or vomiting, adequate PO intake and able to ambulate Anesthetic complications: no   No complications documented.  Last Vitals:  Vitals:   10/05/20 0853 10/05/20 1153  BP: 95/61 102/69  Pulse: 95   Resp: 18 18  Temp: 36.6 C 36.8 C  SpO2:      Last Pain:  Vitals:   10/05/20 1153  TempSrc: Oral  PainSc:    Pain Goal: Patients Stated Pain Goal: 3 (10/05/20 1152)                 Amil Moseman

## 2020-10-05 NOTE — Lactation Note (Signed)
This note was copied from a baby's chart. Lactation Consultation Note  Patient Name: Bridget Miller [id number] Date: 10/05/2020 Reason for consult: Follow-up assessment;Early term 37-38.6wks;[place] Age:29 hours  Follow up to 23 hours old infant. Infant is skin to skin with mother. Mother states infant has not fed well. LC observed infant showing hunger cues. Talked to mother about hand expression, attempted technique but unable to observe colostrum.   Assisted mother with a football latch to left breast. Provided support with pillows.  Infant latched but dimples cheeks and pops off. Mother has short shafted nipples and edema. Several attempts made to latch with no success. Discussed the benefits and barriers of a nipple shield. Attempted latch again with 20-mm nipple shield. Infant latched with ease for 13 minutes. Infant pops off, dry nipple shield.  Father took infant for skin to skin. LC reviewed DEBP pumping frequency/storage and care of parts. Mother started pumping.   Feeding plan:  1-Skin to skin 2-Aim for a deep, comfortable latch with NS 3-Breastfeeding on demand or 8-12 times in 24h period. 4-Keep infant awake during breastfeeding session: massaging breast, infant's hand/shoulder/feet 5-Pump or hand-express and offer EBM prior to supplementation. 6-If needed, supplement following guidelines, paced bottle feeding and fullness cues.  7-Monitor voids and stools as signs good intake.  8-Encouraged maternal rest, hydration and food intake.  9-Contact LC as needed for feeds/support/concerns/questions   Feeding Mother's Current Feeding Choice: Breast Milk and Formula  LATCH Score Latch: Grasps breast easily, tongue down, lips flanged, rhythmical sucking.  Audible Swallowing: Spontaneous and intermittent  Type of Nipple: Everted at rest and after stimulation (short shafted)  Comfort (Breast/Nipple): Soft / non-tender (edema observed)  Hold (Positioning): Assistance needed  to correctly position infant at breast and maintain latch.  LATCH Score: 9   Lactation Tools Discussed/Used Tools: Pump;Flanges;Nipple Dorris Carnes;Shells Nipple shield size: 20 Flange Size: 24 Breast pump type: Double-Electric Breast Pump;Manual Pump Education: Setup, frequency, and cleaning Reason for Pumping: stimulation and supplementation due to NS Pumping frequency: after feedings  Interventions Interventions: Breast feeding basics reviewed;Assisted with latch;Skin to skin;Breast massage;Hand express;Pre-pump if needed;Breast compression;Adjust position;Support pillows;Position options;Expressed milk;Shells;Hand pump;DEBP;Education  Consult Status Consult Status: Follow-up Date: 10/06/20 Follow-up type: In-patient    Bridget Miller 10/05/2020, 11:06 PM

## 2020-10-05 NOTE — Progress Notes (Signed)
PPD # 1 S/P NSVD/3rd deg 3c perineal tear  Live born female  Birth Weight: 8 lb 1.1 oz (3660 g) APGAR: 9, 9  Newborn Delivery   Birth date/time: 10/04/2020 23:43:00 Delivery type: Vaginal, Spontaneous     Baby name: Mahan Delivering provider: Carlynn Herald  Episiotomy:None   Lacerations:3rd degree   circumcision planned inpatient, no voids yet  Feeding: breast  Pain control at delivery: Epidural   S:  Reports feeling well, a bit sore but tolerable. Able to control flatus.             Tolerating po/ No nausea or vomiting             Bleeding is light             Pain controlled with acetaminophen and ibuprofen (OTC)             Up ad lib / ambulatory / voiding without difficulties   O:  A & O x 3, in no apparent distress              VS:  Vitals:   10/05/20 0248 10/05/20 0315 10/05/20 0415 10/05/20 0853  BP:  (!) 109/54 110/66 (P) 95/61  Pulse:  80 87 (P) 95  Resp:  17 16 (P) 18  Temp: 97.6 F (36.4 C) 98.5 F (36.9 C) 99.3 F (37.4 C) (P) 97.8 F (36.6 C)  TempSrc: Oral Oral Oral (P) Oral  SpO2:  100% 100%   Weight:      Height:        LABS:  Recent Labs    10/04/20 1840 10/05/20 0635  WBC 13.7* 17.1*  HGB 12.9 9.8*  HCT 38.9 29.4*  PLT 201 147*    Blood type: --/--/O POS (05/04 1830)  Rubella: Immune (10/19 0000)   I&O: I/O last 3 completed shifts: In: 0  Out: 1700 [Urine:1000; Blood:700]          No intake/output data recorded.  Vaccines: TDaP          UTD                     Flu             UTD                    COVID-19 UTD   Gen: AAO x 3, NAD  Abdomen: soft, non-tender, non-distended             Fundus: firm, non-tender, U-1  Perineum: repair intact, moderate edema, ice pack in place  Lochia: small  Extremities: no edema, no calf pain or tenderness    A/P: PPD # 1 30 y.o., [id number]   Principal Problem:   Postpartum care following vaginal delivery 5/4 Active Problems:   Normal labor and delivery   Third degree perineal laceration  during delivery, IIIc  - strict bowel regimen reviewed   Maternal anemia, with delivery - ABL  - start iron polysaccharide and mag ox  Doing well - stable status  Routine post partum orders  Anticipate discharge tomorrow    Neta Mends, MSN, CNM 10/05/2020, 10:00 AM

## 2020-10-05 NOTE — Plan of Care (Signed)
  Problem: Education: Goal: Knowledge of Childbirth will improve Outcome: Completed/Met Goal: Ability to make informed decisions regarding treatment and plan of care will improve Outcome: Completed/Met Goal: Ability to state and carry out methods to decrease the pain will improve Outcome: Completed/Met Goal: Individualized Educational Video(s) Outcome: Completed/Met

## 2020-10-06 MED ORDER — POLYSACCHARIDE IRON COMPLEX 150 MG PO CAPS: 150.0000 mg | ORAL_CAPSULE | Freq: Every day | ORAL | Status: DC

## 2020-10-06 MED ORDER — ACETAMINOPHEN 500 MG PO TABS: 1000.0000 mg | ORAL_TABLET | Freq: Four times a day (QID) | ORAL | 2 refills | Status: AC | PRN

## 2020-10-06 MED ORDER — IBUPROFEN 600 MG PO TABS: 600.0000 mg | ORAL_TABLET | Freq: Four times a day (QID) | ORAL | 0 refills | Status: DC

## 2020-10-06 MED ORDER — COCONUT OIL OIL: 1.0000 "application " | TOPICAL_OIL | 0 refills | Status: DC | PRN

## 2020-10-06 MED ORDER — BENZOCAINE-MENTHOL 20-0.5 % EX AERO: 1.0000 "application " | INHALATION_SPRAY | CUTANEOUS | Status: DC | PRN

## 2020-10-06 MED ORDER — MAGNESIUM OXIDE -MG SUPPLEMENT 400 (240 MG) MG PO TABS: 400.0000 mg | ORAL_TABLET | Freq: Every day | ORAL | Status: DC

## 2020-10-06 NOTE — Discharge Instructions (Signed)
Lactation outpatient support - home visit  Linda Coppola RN, MHA, IBCLC at [facility]: Lactation Consultant  [email or web address] Mail: [email or web address] Tel: [phone]    Additional resources:  [facility] [email or web address]   Chiropractic specialist   Dr. Leanna Hastings [email or web address]  Craniosacral therapy for baby  Erin Balkind  [email or web address]  

## 2020-10-06 NOTE — Lactation Note (Signed)
This note was copied from Bridget baby's chart. Lactation Consultation Note  Patient Name: Bridget Miller Date: 10/06/2020 Reason for consult: Follow-up assessment;Early term 37-38.6wks Age:29 hours  Follow up with 36 hours old infant with 5.74% weight loss at the time of this visit. Mother reports breastfeeding is going well. Infant is latching better and seems content after feeding. Infant just had Bridget circumcision. Father of infant is present and supportive.   Reviewed normal newborn behavior and cluster feeding. Reviewed Lactation Services brochure and encouraged to call for support. Praised parents for their efforts and dedication.   Discharge Plan: 1. Breastfeed following hunger cues or 8 - 12 times in 24h period to establish good milk supply.   2. Ensure infant has Bridget deep latch and/or chin tugging to improve latch. 3. Pump or hand-express and offer EBM prior to formula supplementation. 4. If needed supplement with formula following guidelines, paced bottle feeding and fullness cues.   5. Encouraged maternal rest, hydration and food intake.  6. Contact Lactation Services or local resources for support, questions or concerns.     Feeding Mother's Current Feeding Choice: Breast Milk  Lactation Tools Discussed/Used Tools: Pump;Flanges;Nipple Dorris Carnes;Shells Nipple shield size: 20 Flange Size: 24 Breast pump type: Double-Electric Breast Pump Reason for Pumping: stimulationand supplementation Pumping frequency: Q3  Interventions Interventions: Breast feeding basics reviewed;Skin to skin;DEBP;Shells;Expressed milk;Education;Support pillows  Discharge Discharge Education: Engorgement and breast care;Warning signs for feeding baby Pump: Personal;Manual WIC Program: No  Consult Status Consult Status: Complete Date: 10/06/20 Follow-up type: Call as needed    Bridget Miller Bridget Miller 10/06/2020, 12:38 PM

## 2020-10-06 NOTE — Discharge Summary (Signed)
OB Discharge Summary  Patient Name: Bridget Miller DOB: [birth year] MRN: [id number]  Date of admission: 10/04/2020 Delivering provider: Carlynn Herald   Admitting diagnosis: Normal labor and delivery [O80] Pregnancy [Z34.90] Intrauterine pregnancy: [redacted]w[redacted]d     Secondary diagnosis: Patient Active Problem List   Diagnosis Date Noted  . Postpartum care following vaginal delivery 5/4 10/05/2020  . Third degree perineal laceration during delivery, iiic 10/05/2020  . Maternal anemia, with delivery - ABL 10/05/2020  . Normal labor and delivery 10/04/2020  . Incompetent cervix during second trimester, antepartum 06/13/2020   Additional problems:none   Date of discharge: 10/06/2020   Discharge diagnosis: Principal Problem:   Postpartum care following vaginal delivery 5/4 Active Problems:   Normal labor and delivery   Third degree perineal laceration during delivery, iiic   Maternal anemia, with delivery - ABL                                                              Post partum procedures:none  Augmentation: AROM Pain control: Epidural  Laceration:3rd degree  Episiotomy:None  Complications: 3rd deg 3c laceration  Hospital course:  Onset of Labor With Vaginal Delivery      30 y.o. yo G2P1011 at [redacted]w[redacted]d was admitted in Active Labor on 10/04/2020. Patient had an uncomplicated labor course as follows:  Membrane Rupture Time/Date: 11:16 PM ,10/04/2020   Delivery Method:Vaginal, Spontaneous  Episiotomy: None  Lacerations:  3rd degree  Patient had an uncomplicated postpartum course.  She is ambulating, tolerating a regular diet, passing flatus, and urinating well. Patient is discharged home in stable condition on 10/06/20.  Newborn Data: Birth date:10/04/2020  Birth time:11:43 PM  Gender:Female  Living status:Living  Apgars:9 ,9  Weight:3660 g   Physical exam  Vitals:   10/05/20 1153 10/05/20 1650 10/05/20 2045 10/06/20 0615  BP: 102/69 116/77 107/65 (!) 99/57  Pulse:  77 79 71   Resp: 18 16 16 16   Temp: 98.2 F (36.8 C) 98 F (36.7 C) 97.7 F (36.5 C) 97.8 F (36.6 C)  TempSrc: Oral Oral Oral Oral  SpO2:  100% 100% 99%  Weight:      Height:       General: alert, cooperative and no distress Lochia: appropriate Uterine Fundus: firm Incision: N/A Perineum: repair intact, mild ecchymosis and edema DVT Evaluation: No cords or calf tenderness. No significant calf/ankle edema. Labs: Lab Results  Component Value Date   WBC 17.1 (H) 10/05/2020   HGB 9.8 (L) 10/05/2020   HCT 29.4 (L) 10/05/2020   MCV 85.7 10/05/2020   PLT 147 (L) 10/05/2020   No flowsheet data found. Edinburgh Postnatal Depression Scale Screening Tool 10/05/2020  I have been able to laugh and see the funny side of things. 0  I have looked forward with enjoyment to things. 0  I have blamed myself unnecessarily when things went wrong. 1  I have been anxious or worried for no good reason. 0  I have felt scared or panicky for no good reason. 0  Things have been getting on top of me. 0  I have been so unhappy that I have had difficulty sleeping. 0  I have felt sad or miserable. 0  I have been so unhappy that I have been crying. 0  The thought of harming myself has  occurred to me. 0  Edinburgh Postnatal Depression Scale Total 1   Vaccines: TDaP          UTD         Flu             UTD                    COVID-19 UTD  Discharge instruction:  per After Visit Summary,  [facility] booklet and  "Understanding Mother & Baby Care" hospital booklet  After Visit Meds:  Allergies as of 10/06/2020   No Known Allergies     Medication List    STOP taking these medications   ferrous sulfate 325 (65 FE) MG tablet   progesterone 200 MG Supp     TAKE these medications   acetaminophen 500 MG tablet Commonly known as: TYLENOL Take 2 tablets (1,000 mg total) by mouth every 6 (six) hours as needed.   benzocaine-Menthol 20-0.5 % Aero Commonly known as: DERMOPLAST Apply 1 application topically  as needed for irritation (perineal discomfort).   coconut oil Oil Apply 1 application topically as needed.   ibuprofen 600 MG tablet Commonly known as: ADVIL Take 1 tablet (600 mg total) by mouth every 6 (six) hours.   iron polysaccharides 150 MG capsule Commonly known as: Ferrex 150 Take 1 capsule (150 mg total) by mouth daily.   magnesium oxide 400 (240 Mg) MG tablet Commonly known as: MAG-OX Take 1 tablet (400 mg total) by mouth daily. For prevention of constipation.   PRENATAL PO Take 1 tablet by mouth every evening.            Discharge Care Instructions  (From admission, onward)         Start     Ordered   10/06/20 0000  Discharge wound care:       Comments: Sitz baths 2 times /day with warm water x 1 week. May add herbals: 1 ounce dried comfrey leaf* 1 ounce calendula flowers 1 ounce lavender flowers  Supplies can be found online at [employer] sources at [employer], [employer]  1/2 ounce dried uva ursi leaves 1/2 ounce witch hazel blossoms (if you can find them) 1/2 ounce dried sage leaf 1/2 cup sea salt Directions: Bring 2 quarts of water to a boil. Turn off heat, and place 1 ounce (approximately 1 large handful) of the above mixed herbs (not the salt) into the pot. Steep, covered, for 30 minutes.  Strain the liquid well with a fine mesh strainer, and discard the herb material. Add 2 quarts of liquid to the tub, along with the 1/2 cup of salt. This medicinal liquid can also be made into compresses and peri-rinses.   10/06/20 1059          Diet: iron rich diet  Activity: Advance as tolerated. Pelvic rest for 6 weeks.   Postpartum contraception: Not Discussed  Newborn Data: Live born female  Birth Weight: 8 lb 1.1 oz (3660 g) APGAR: 9, 9  Newborn Delivery   Birth date/time: 10/04/2020 23:43:00 Delivery type: Vaginal, Spontaneous      named Mahar Baby Feeding: Breast Disposition:home with mother Circumcision: inpatient / Dr.  Amado Nash   Delivery Report:  Review the Delivery Report for details.    Follow up:  Follow-up Information    Shea Evans, MD. Schedule an appointment as soon as possible for a visit in 2 week(s).   Specialty: Obstetrics and Gynecology Why: Perineal laceration 3rd deg.  check Contact information: [address] [place] [address] [phone]                 Signed: Neta Mends, CNM, MSN 10/06/2020, 11:00 AM

## 2020-10-12 ENCOUNTER — Inpatient Hospital Stay (HOSPITAL_COMMUNITY): Admit: 2020-10-12 | Payer: Self-pay

## 2021-10-14 ENCOUNTER — Other Ambulatory Visit: Payer: Self-pay

## 2021-10-14 ENCOUNTER — Emergency Department (HOSPITAL_BASED_OUTPATIENT_CLINIC_OR_DEPARTMENT_OTHER): Payer: 59

## 2021-10-14 ENCOUNTER — Emergency Department (HOSPITAL_BASED_OUTPATIENT_CLINIC_OR_DEPARTMENT_OTHER)
Admission: EM | Admit: 2021-10-14 | Discharge: 2021-10-14 | Disposition: A | Discharge status: Home / Self Care | Payer: 59 | Attending: Emergency Medicine | Admitting: Emergency Medicine

## 2021-10-14 ENCOUNTER — Encounter (HOSPITAL_BASED_OUTPATIENT_CLINIC_OR_DEPARTMENT_OTHER): Payer: Self-pay | Admitting: Emergency Medicine

## 2021-10-14 DIAGNOSIS — K5900 Constipation, unspecified: Secondary | ICD-10-CM | POA: Insufficient documentation

## 2021-10-14 LAB — URINALYSIS, ROUTINE W REFLEX MICROSCOPIC
Bilirubin Urine: NEGATIVE
Glucose, UA: NEGATIVE mg/dL
Ketones, ur: NEGATIVE mg/dL
Leukocytes,Ua: NEGATIVE
Nitrite: NEGATIVE
Protein, ur: NEGATIVE mg/dL
Specific Gravity, Urine: 1.015 (ref 1.005–1.030)
pH: 7 (ref 5.0–8.0)

## 2021-10-14 LAB — URINALYSIS, MICROSCOPIC (REFLEX)

## 2021-10-14 LAB — PREGNANCY, URINE: Preg Test, Ur: NEGATIVE

## 2021-10-14 MED ORDER — FLEET ENEMA 7-19 GM/118ML RE ENEM
1.0000 | ENEMA | Freq: Once | RECTAL | Status: AC
  Administered 2021-10-14: 1 via RECTAL
  Filled 2021-10-14: qty 1

## 2021-10-14 MED ORDER — BISACODYL 5 MG PO TBEC
10.0000 mg | DELAYED_RELEASE_TABLET | Freq: Once | ORAL | Status: AC
  Administered 2021-10-14: 10 mg via ORAL
  Filled 2021-10-14: qty 2

## 2021-10-14 NOTE — ED Triage Notes (Signed)
Pt arrives pov, steady slow gait with c/o constipation and rectal pressure and bleeding since yesterday ?

## 2021-10-14 NOTE — ED Provider Notes (Signed)
?[facility] EMERGENCY DEPARTMENT ?Provider Note ? ? ?CSN: [id number] ?Arrival date & time: 10/14/21  0946 ? ?  ? ?History ? ?Chief Complaint  ?Patient presents with  ? Constipation  ? ? ?Bridget Miller is a 31 y.o. female with a past medical history significant for previous issues with constipation, rectal fissures after delivery who presents with concern for pain with bowel movements, constipation since yesterday, some blood in stool.  Patient denies any nausea, vomiting, fever, chills.  She denies any narcotic use.  She is trying some at home laxative with minimal improvement.  She has not tried an enema at home yet. ? ? ?Constipation ? ?  ? ?Home Medications ?Prior to Admission medications   ?Medication Sig Start Date End Date Taking? Authorizing Provider  ?benzocaine-Menthol (DERMOPLAST) 20-0.5 % AERO Apply 1 application topically as needed for irritation (perineal discomfort). 10/06/20   Neta MendsPaul, Daniela C, CNM  ?coconut oil OIL Apply 1 application topically as needed. 10/06/20   Neta MendsPaul, Daniela C, CNM  ?ibuprofen (ADVIL) 600 MG tablet Take 1 tablet (600 mg total) by mouth every 6 (six) hours. 10/06/20   Neta MendsPaul, Daniela C, CNM  ?iron polysaccharides (FERREX 150) 150 MG capsule Take 1 capsule (150 mg total) by mouth daily. 10/06/20   Neta MendsPaul, Daniela C, CNM  ?magnesium oxide (MAG-OX) 400 (240 Mg) MG tablet Take 1 tablet (400 mg total) by mouth daily. For prevention of constipation. 10/06/20   Neta MendsPaul, Daniela C, CNM  ?Prenatal Vit-Fe Fumarate-FA (PRENATAL PO) Take 1 tablet by mouth every evening.    [provider]  ?   ? ?Allergies    ?Patient has no known allergies.   ? ?Review of Systems   ?Review of Systems  ?Gastrointestinal:  Positive for blood in stool and constipation.  ?All other systems reviewed and are negative. ? ?Physical Exam ?Updated Vital Signs ?BP 112/85 (BP Location: Left Arm)   Pulse 68   Temp 98.4 ?F (36.9 ?C) (Oral)   Resp 18   Ht 5\' 5"  (1.651 m)   Wt 70 kg   LMP 09/16/2021   SpO2 100%    BMI 25.68 kg/m?  ?Physical Exam ?Vitals and nursing note reviewed.  ?Constitutional:   ?   General: She is not in acute distress. ?   Appearance: Normal appearance.  ?HENT:  ?   Head: Normocephalic and atraumatic.  ?Eyes:  ?   General:     ?   Right eye: No discharge.     ?   Left eye: No discharge.  ?Cardiovascular:  ?   Rate and Rhythm: Normal rate and regular rhythm.  ?   Heart sounds: No murmur heard. ?  No friction rub. No gallop.  ?Pulmonary:  ?   Effort: Pulmonary effort is normal.  ?   Breath sounds: Normal breath sounds.  ?Abdominal:  ?   General: Bowel sounds are normal.  ?   Palpations: Abdomen is soft.  ?Genitourinary: ?   Comments: Patient with soft brown stool in rectal vault, significant pain with digital rectal exam I do not note rectal fissure, thrombosed internal or external hemorrhoids.  Able to disimpact a small amount of stool with rectal exam. ?Skin: ?   General: Skin is warm and dry.  ?   Capillary Refill: Capillary refill takes less than 2 seconds.  ?Neurological:  ?   Mental Status: She is alert and oriented to person, place, and time.  ?Psychiatric:     ?   Mood and Affect: Mood  normal.     ?   Behavior: Behavior normal.  ? ? ?ED Results / Procedures / Treatments   ?Labs ?(all labs ordered are listed, but only abnormal results are displayed) ?Labs Reviewed  ?URINALYSIS, ROUTINE W REFLEX MICROSCOPIC - Abnormal; Notable for the following components:  ?    Result Value  ? Hgb urine dipstick TRACE (*)   ? All other components within normal limits  ?URINALYSIS, MICROSCOPIC (REFLEX) - Abnormal; Notable for the following components:  ? Bacteria, UA RARE (*)   ? All other components within normal limits  ?PREGNANCY, URINE  ? ? ?EKG ?None ? ?Radiology ?DG Abdomen 1 View ? ?Result Date: 10/14/2021 ?CLINICAL DATA:  Pt c/o constipation, rectal bleeding and pressure since yesterday. EXAM: ABDOMEN - 1 VIEW COMPARISON:  None Available. FINDINGS: The bowel gas pattern is normal. No radio-opaque calculi  or other significant radiographic abnormality are seen. IMPRESSION: Negative. Electronically Signed   By: Amie Portland M.D.   On: 10/14/2021 11:30   ? ?Procedures ?Procedures  ? ? ?Medications Ordered in ED ?Medications  ?bisacodyl (DULCOLAX) EC tablet 10 mg (10 mg Oral Given 10/14/21 1101)  ?sodium phosphate (FLEET) 7-19 GM/118ML enema 1 enema (1 enema Rectal Given 10/14/21 1102)  ? ? ?ED Course/ Medical Decision Making/ A&P ?  ?                        ?Medical Decision Making ?Amount and/or Complexity of Data Reviewed ?Labs: ordered. ?Radiology: ordered. ? ?Risk ?OTC drugs. ? ? ?This patient is a 31 y.o. female who presents to the ED for concern of rectal pain, constipation, burning with attempted bowel movement, this involves an extensive number of treatment options, and is a complaint that carries with it a high risk of complications and morbidity. The emergent differential diagnosis prior to evaluation includes, but is not limited to, constipation, hemorrhoids, rectal fissure, abscess .  ? ?This is not an exhaustive differential.  ? ?Past Medical History / Co-morbidities / Social History: ?Rectal fissures, constipation ? ?Additional history: ?Chart reviewed. Pertinent results include: Outpatient OB/GYN, urgent care, gynecology visit ? ?Physical Exam: ?Physical exam performed. The pertinent findings include: No tenderness to palpation of abdominal exam, patient is nontoxic-appearing, her external rectum is within normal limits, I do not note significant anal fissures, she has soft brown stool in the rectal vault without bright red blood per rectum, was able to disimpact a small amount of stool with rectal exam. ? ?Lab Tests: ?I ordered, and personally interpreted labs.  The pertinent results include: Unremarkable urinalysis with trace hemoglobin, negative pregnancy test. ?  ?Imaging Studies: ?I ordered imaging studies including radiograph of the abdomen. I independently visualized and interpreted imaging which  showed no significant abnormalities.. I agree with the radiologist interpretation. ? ?Medications: ?I ordered medication including Dulcolax, Fleet enema for constipation. Reevaluation of the patient after these medicines showed that the patient resolved. I have reviewed the patients home medicines and have made adjustments as needed. ?  ?Disposition: ?After consideration of the diagnostic results and the patients response to treatment, I feel that patient may consider some hemorrhoid cream, or other treatments for her constipation, encourage plenty of fluids, follow-up with PCP..  ? ?I discussed this case with my attending physician Dr. Lockie Mola who cosigned this note including patient's presenting symptoms, physical exam, and planned diagnostics and interventions. Attending physician stated agreement with plan or made changes to plan which were implemented.   ? ?Final Clinical Impression(s) /  ED Diagnoses ?Final diagnoses:  ?Constipation, unspecified constipation type  ? ? ?Rx / DC Orders ?ED Discharge Orders   ? ? None  ? ?  ? ? ?  ?Olene Floss, PA-C ?10/14/21 1136 ? ?  ?Virgina Norfolk, DO ?10/14/21 1201 ? ?

## 2021-10-14 NOTE — Discharge Instructions (Addendum)
You can use hemorrhoid cream or other over-the-counter topicals such as Preparation H to help with any pain or irritation of the rectum from rectal fissures or hemorrhoids.  I recommend that you drink lots of water at least 50 to 64 ounces of water a day, encourage you to eat a high-fiber diet with lots of vegetables, or take fiber supplements, as well as use of laxatives including MiraLAX once to twice daily, or other over-the-counter laxatives.  It may be helpful for you to obtain some enemas to use at the store for times when you have severe constipation that feels like he needs relief.  Otherwise recommend that you follow-up with your PCP who may consider referral to gastroenterology if this problem persists despite treatment as above. ?

## 2021-10-14 NOTE — ED Notes (Signed)
Patient had a bowel movement 

## 2021-10-14 NOTE — ED Notes (Signed)
States that she has been constipated since yesterday. Last bowel movement was Friday.States that she thinks she has a  fissure.States she saw blood when she wiped ?

## 2023-05-12 IMAGING — DX DG ABDOMEN 1V
1 series · 1 of 1 positions shown · non-contrast
Comparison: None Available.

CLINICAL DATA: Pt c/o constipation, rectal bleeding and pressure
since yesterday.

EXAM:
ABDOMEN - 1 VIEW

[abdomen kub]
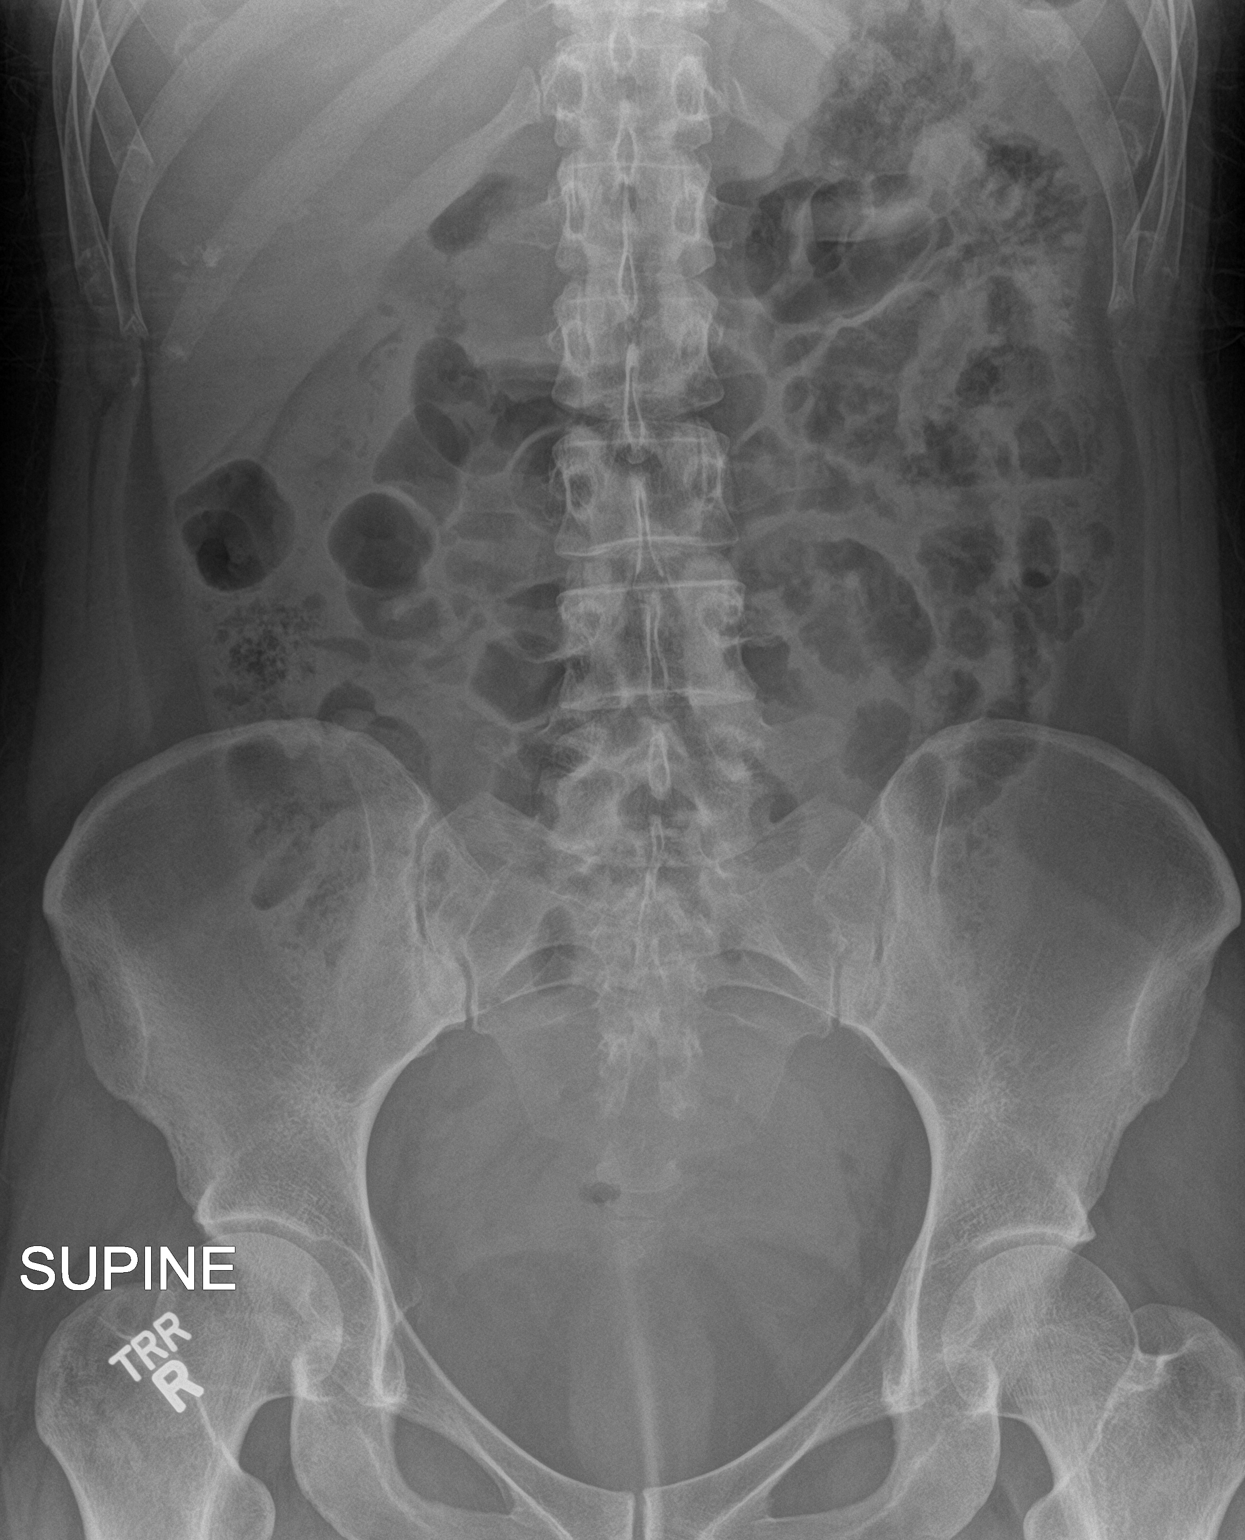

[1 of 1 positions shown; findings below may reference images not displayed]

FINDINGS: The bowel gas pattern is normal. No radio-opaque calculi or other
significant radiographic abnormality are seen.
IMPRESSION: Negative.

## 2023-11-19 LAB — HEPATITIS C ANTIBODY: HCV Ab: NEGATIVE

## 2023-11-19 LAB — OB RESULTS CONSOLE GC/CHLAMYDIA
Chlamydia: NEGATIVE
Neisseria Gonorrhea: NEGATIVE

## 2023-11-19 LAB — OB RESULTS CONSOLE HIV ANTIBODY (ROUTINE TESTING): HIV: NONREACTIVE

## 2023-11-19 LAB — OB RESULTS CONSOLE HEPATITIS B SURFACE ANTIGEN: Hepatitis B Surface Ag: NEGATIVE

## 2023-11-19 LAB — OB RESULTS CONSOLE RUBELLA ANTIBODY, IGM: Rubella: IMMUNE

## 2023-11-20 ENCOUNTER — Other Ambulatory Visit: Payer: Self-pay | Admitting: Obstetrics & Gynecology

## 2023-11-24 ENCOUNTER — Telehealth (HOSPITAL_COMMUNITY): Payer: Self-pay | Admitting: *Deleted

## 2023-11-24 ENCOUNTER — Encounter (HOSPITAL_COMMUNITY): Payer: Self-pay | Admitting: *Deleted

## 2023-11-24 NOTE — Telephone Encounter (Signed)
 Preadmission screen

## 2023-11-25 NOTE — Anesthesia Preprocedure Evaluation (Signed)
 Anesthesia Evaluation  Patient identified by MRN, date of birth, ID band Patient awake    Reviewed: Allergy & Precautions, H&P , NPO status , Patient's Chart, lab work & pertinent test results, reviewed documented beta blocker date and time   History of Anesthesia Complications Negative for: history of anesthetic complications  Airway Mallampati: II  TM Distance: >3 FB Neck ROM: full    Dental no notable dental hx.    Pulmonary neg pulmonary ROS   Pulmonary exam normal breath sounds clear to auscultation       Cardiovascular negative cardio ROS Normal cardiovascular exam Rhythm:regular Rate:Normal     Neuro/Psych negative neurological ROS  negative psych ROS   GI/Hepatic negative GI ROS, Neg liver ROS,,,  Endo/Other  negative endocrine ROS    Renal/GU negative Renal ROS  negative genitourinary   Musculoskeletal   Abdominal   Peds  Hematology  (+) Blood dyscrasia, anemia   Anesthesia Other Findings   Reproductive/Obstetrics (+) Pregnancy                             Anesthesia Physical Anesthesia Plan  ASA: 2  Anesthesia Plan: Spinal   Post-op Pain Management: Minimal or no pain anticipated   Induction: Intravenous  PONV Risk Score and Plan: 2 and Treatment may vary due to age or medical condition  Airway Management Planned: Natural Airway and Simple Face Mask  Additional Equipment: None  Intra-op Plan:   Post-operative Plan:   Informed Consent: I have reviewed the patients History and Physical, chart, labs and discussed the procedure including the risks, benefits and alternatives for the proposed anesthesia with the patient or authorized representative who has indicated his/her understanding and acceptance.       Plan Discussed with: Anesthesiologist and CRNA  Anesthesia Plan Comments:         Anesthesia Quick Evaluation

## 2023-11-26 ENCOUNTER — Other Ambulatory Visit: Payer: Self-pay

## 2023-11-26 ENCOUNTER — Ambulatory Visit (HOSPITAL_COMMUNITY)
Admission: RE | Admit: 2023-11-26 | Discharge: 2023-11-26 | Disposition: A | Discharge status: Home / Self Care | Attending: Obstetrics & Gynecology | Admitting: Obstetrics & Gynecology

## 2023-11-26 ENCOUNTER — Ambulatory Visit (HOSPITAL_BASED_OUTPATIENT_CLINIC_OR_DEPARTMENT_OTHER): Admitting: Anesthesiology

## 2023-11-26 ENCOUNTER — Encounter (HOSPITAL_COMMUNITY)
Admission: RE | Disposition: A | Discharge status: Home / Self Care | Payer: Self-pay | Source: Home / Self Care | Attending: Obstetrics & Gynecology

## 2023-11-26 ENCOUNTER — Encounter (HOSPITAL_COMMUNITY): Payer: Self-pay | Admitting: Obstetrics & Gynecology

## 2023-11-26 ENCOUNTER — Ambulatory Visit (HOSPITAL_COMMUNITY): Admitting: Anesthesiology

## 2023-11-26 DIAGNOSIS — Z3A13 13 weeks gestation of pregnancy: Secondary | ICD-10-CM | POA: Diagnosis not present

## 2023-11-26 DIAGNOSIS — N883 Incompetence of cervix uteri: Secondary | ICD-10-CM | POA: Diagnosis not present

## 2023-11-26 DIAGNOSIS — O3431 Maternal care for cervical incompetence, first trimester: Secondary | ICD-10-CM | POA: Insufficient documentation

## 2023-11-26 HISTORY — PX: CERVICAL CERCLAGE: SHX1329

## 2023-11-26 LAB — CBC
HCT: 30.1 % — ABNORMAL LOW (ref 36.0–46.0)
Hemoglobin: 10 g/dL — ABNORMAL LOW (ref 12.0–15.0)
MCH: 26 pg (ref 26.0–34.0)
MCHC: 33.2 g/dL (ref 30.0–36.0)
MCV: 78.4 fL — ABNORMAL LOW (ref 80.0–100.0)
Platelets: 211 10*3/uL (ref 150–400)
RBC: 3.84 MIL/uL — ABNORMAL LOW (ref 3.87–5.11)
RDW: 13.8 % (ref 11.5–15.5)
WBC: 8.9 10*3/uL (ref 4.0–10.5)
nRBC: 0 % (ref 0.0–0.2)

## 2023-11-26 LAB — TYPE AND SCREEN
ABO/RH(D): O POS
Antibody Screen: NEGATIVE

## 2023-11-26 LAB — RPR: RPR Ser Ql: NONREACTIVE

## 2023-11-26 SURGERY — CERCLAGE, CERVIX, VAGINAL APPROACH
Anesthesia: Spinal

## 2023-11-26 MED ORDER — FENTANYL CITRATE (PF) 100 MCG/2ML IJ SOLN
INTRAMUSCULAR | Status: AC
  Filled 2023-11-26: qty 2

## 2023-11-26 MED ORDER — CEFAZOLIN SODIUM-DEXTROSE 2-4 GM/100ML-% IV SOLN
2.0000 g | INTRAVENOUS | Status: AC
  Administered 2023-11-26: 2 g via INTRAVENOUS

## 2023-11-26 MED ORDER — LACTATED RINGERS IV SOLN: INTRAVENOUS | Status: DC

## 2023-11-26 MED ORDER — FENTANYL CITRATE (PF) 100 MCG/2ML IJ SOLN: 25.0000 ug | INTRAMUSCULAR | Status: DC | PRN

## 2023-11-26 MED ORDER — POVIDONE-IODINE 10 % EX SWAB: 2.0000 | Freq: Once | CUTANEOUS | Status: DC

## 2023-11-26 MED ORDER — MEPERIDINE HCL 25 MG/ML IJ SOLN: 6.2500 mg | INTRAMUSCULAR | Status: DC | PRN

## 2023-11-26 MED ORDER — ONDANSETRON HCL 4 MG/2ML IJ SOLN: 4.0000 mg | Freq: Once | INTRAMUSCULAR | Status: DC | PRN

## 2023-11-26 MED ORDER — CHLOROPROCAINE HCL (PF) 3 % IJ SOLN
INTRAMUSCULAR | Status: DC | PRN
Start: 2023-11-26 — End: 2023-11-26

## 2023-11-26 MED ORDER — ONDANSETRON HCL 4 MG/2ML IJ SOLN
INTRAMUSCULAR | Status: AC
  Filled 2023-11-26: qty 2

## 2023-11-26 MED ORDER — ONDANSETRON HCL 4 MG/2ML IJ SOLN
INTRAMUSCULAR | Status: DC | PRN
  Administered 2023-11-26: 4 mg via INTRAVENOUS

## 2023-11-26 MED ORDER — CHLOROPROCAINE HCL (PF) 3 % IJ SOLN
INTRAMUSCULAR | Status: DC | PRN
Start: 2023-11-26 — End: 2023-11-26
  Administered 2023-11-26: 40 mg

## 2023-11-26 MED ORDER — OXYCODONE HCL 5 MG PO TABS: 5.0000 mg | ORAL_TABLET | Freq: Once | ORAL | Status: DC | PRN

## 2023-11-26 MED ORDER — ACETAMINOPHEN 325 MG PO TABS: 650.0000 mg | ORAL_TABLET | Freq: Four times a day (QID) | ORAL | Status: AC | PRN

## 2023-11-26 MED ORDER — OXYCODONE HCL 5 MG/5ML PO SOLN: 5.0000 mg | Freq: Once | ORAL | Status: DC | PRN

## 2023-11-26 MED ORDER — FENTANYL CITRATE (PF) 100 MCG/2ML IJ SOLN
INTRAMUSCULAR | Status: DC | PRN
  Administered 2023-11-26: 25 ug via INTRATHECAL

## 2023-11-26 SURGICAL SUPPLY — 17 items
CANISTER SUCT 3000ML PPV (MISCELLANEOUS) ×1 IMPLANT
GLOVE BIO SURGEON STRL SZ7 (GLOVE) ×1 IMPLANT
GLOVE BIOGEL PI IND STRL 7.0 (GLOVE) ×1 IMPLANT
GOWN STRL REUS W/TWL LRG LVL3 (GOWN DISPOSABLE) ×2 IMPLANT
NDL MAYO CATGUT SZ4 TPR NDL (NEEDLE) IMPLANT
NEEDLE MAYO CATGUT SZ4 (NEEDLE) IMPLANT
NS IRRIG 1000ML POUR BTL (IV SOLUTION) ×1 IMPLANT
PACK VAGINAL MINOR WOMEN LF (CUSTOM PROCEDURE TRAY) ×1 IMPLANT
PAD OB MATERNITY 4.3X12.25 (PERSONAL CARE ITEMS) ×1 IMPLANT
PAD PREP 24X48 CUFFED NSTRL (MISCELLANEOUS) ×1 IMPLANT
SUT MERSILENE FIBER S 5 MO-4 1 (SUTURE) IMPLANT
SUT PROLENE 1 CT 1 30 (SUTURE) ×1 IMPLANT
SYR BULB IRRIGATION 50ML (SYRINGE) ×1 IMPLANT
TOWEL OR 17X24 6PK STRL BLUE (TOWEL DISPOSABLE) ×2 IMPLANT
TRAY FOLEY W/BAG SLVR 14FR (SET/KITS/TRAYS/PACK) ×1 IMPLANT
TUBING NON-CON 1/4 X 20 CONN (TUBING) ×1 IMPLANT
YANKAUER SUCT BULB TIP NO VENT (SUCTIONS) ×1 IMPLANT

## 2023-11-26 NOTE — Op Note (Signed)
 11/26/23 Bridget Miller  Procedure: McDonald's cervical cerclage  Pre op diagnosis  cervical incompetence 13 weeks  Post op diagnosis Same Surgeon  Robbi Render MD  Assistant None Anesthesia Spinal  IV LR 1000 cc  EBL 10 cc  Urine 100 cc clear in foley  Procedure:  Informed written consent was obtained after reviewing risks/ complications and future risk of infection/ miscarriage/ PROM and PTD. Understands and agrees.   Patient was brought to the operating room with IV running. She received preop 2 gm Ancef . She underwent Spinal anesthesia without complications. She was given dorsolithotomy position. Parts were prepped and draped in standard fashion. Bladder was catheterized with foley. Speculum was placed and cervix was evaluated. External os appeared patulous and cervix felt very soft, short at  2.5 cm length  Bladder reflection noted at cervicovaginal junction. McDonald cerclage performed starting at 1 o'clock and going in anti-clock direction taking regular purse string stitch while grasping cervix with ring forceps using Mersilene tape. Knot tied at 1 o'clock. Os palpated to be closed. Bleeding stopped. Hemostasis excellent. Instruments removed. All counts correct x2.   Patient will be discharged home today. Warning signs of infection and excessive bleeding and miscarriage precautions reviewed.   Robbi Render, MD.

## 2023-11-26 NOTE — Transfer of Care (Signed)
 Immediate Anesthesia Transfer of Care Note  Patient: Bridget Miller  Procedure(s) Performed: CERCLAGE, CERVIX, VAGINAL APPROACH  Patient Location: PACU  Anesthesia Type:Spinal  Level of Consciousness: awake, alert , and oriented  Airway & Oxygen Therapy: Patient Spontanous Breathing  Post-op Assessment: Report given to RN and Post -op Vital signs reviewed and stable  Post vital signs: Reviewed and stable  Last Vitals:  Vitals Value Taken Time  BP 86/57 11/26/23 09:45  Temp 36.5 C 11/26/23 09:45  Pulse 57 11/26/23 09:51  Resp 18 11/26/23 09:51  SpO2 100 % 11/26/23 09:51  Vitals shown include unfiled device data.  Last Pain:  Vitals:   11/26/23 0945  TempSrc: Oral  PainSc: 0-No pain         Complications: No notable events documented.

## 2023-11-26 NOTE — Progress Notes (Signed)
 Pt d/c home with husband.  D/c instructions reviewed with pt.

## 2023-11-26 NOTE — Anesthesia Postprocedure Evaluation (Signed)
 Anesthesia Post Note  Patient: Bridget Miller  Procedure(s) Performed: CERCLAGE, CERVIX, VAGINAL APPROACH     Patient location during evaluation: PACU Anesthesia Type: Spinal Level of consciousness: oriented and awake and alert Pain management: pain level controlled Vital Signs Assessment: post-procedure vital signs reviewed and stable Respiratory status: spontaneous breathing, respiratory function stable and patient connected to nasal cannula oxygen Cardiovascular status: blood pressure returned to baseline and stable Postop Assessment: no headache, no backache and no apparent nausea or vomiting Anesthetic complications: no   No notable events documented.  Last Vitals:  Vitals:   11/26/23 1030 11/26/23 1045  BP: 91/66 (!) 126/104  Pulse: 66 (!) 57  Resp: 18 (!) 21  Temp:    SpO2: 100% 100%    Last Pain:  Vitals:   11/26/23 1045  TempSrc:   PainSc: 0-No pain   Pain Goal:                   Malayla Granberry

## 2023-11-26 NOTE — Anesthesia Procedure Notes (Signed)
 Spinal  Patient location during procedure: OR Start time: 11/26/2023 9:00 AM End time: 11/26/2023 9:05 AM Reason for block: surgical anesthesia Staffing Performed: other anesthesia staff  Anesthesiologist: Mallory Manus, MD Performed by: Mallory Manus, MD Authorized by: Mallory Manus, MD   Preanesthetic Checklist Completed: patient identified, IV checked, site marked, risks and benefits discussed, surgical consent, monitors and equipment checked, pre-op evaluation and timeout performed Spinal Block Patient position: sitting Prep: DuraPrep Patient monitoring: heart rate, cardiac monitor, continuous pulse ox and blood pressure Approach: midline Location: L4-5 Injection technique: single-shot Needle Needle type: Sprotte  Needle gauge: 24 G Needle length: 9 cm Assessment Sensory level: T4 Events: CSF return

## 2023-11-26 NOTE — H&P (Addendum)
 Bridget Miller is an 33 y.o. female. G3P1011 at 13 wks by 10 wk sono. Here for cervical cerclage for h/o incompetent cervix needing emerg cerclage at 20 wks last pregnancy  LMP uncertain. 10 wks for for [facility]. Noted suspected prominent NT but NIPT is low risk.  Normal Pap hx no LEEP, No D&E. No HSV.   Past Medical History:  Diagnosis Date   Incompetent cervix during second trimester, antepartum 06/13/2020   Medical history non-contributory     Past Surgical History:  Procedure Laterality Date   CERVICAL CERCLAGE N/A 06/13/2020   Procedure: Emergency McDonald CERVICAL CERCLAGE;  Surgeon: Barbette Knock, MD;  Location: [facility] LD [facility]S;  Service: Gynecology;  Laterality: N/A;  EDD: 10/12/20   NO PAST SURGERIES      No family history on file.  Social History:  reports that she has never smoked. She has never used smokeless tobacco. She reports that she does not drink alcohol and does not use drugs.  Allergies: No Known Allergies  Medications Prior to Admission  Medication Sig Dispense Refill Last Dose/Taking   Prenatal Vit-Fe Fumarate-FA (PRENATAL PO) Take 1 tablet by mouth every evening.   Taking    Review of Systems  unknown if currently breastfeeding. Physical Exam Physical exam:  A&O x 3, no acute distress. Pleasant HEENT neg, no thyromegaly Lungs CTA bilat CV RRR, S1S2 normal Abdo soft, non tender, non acute Extr no edema/ tenderness Pelvic deferred FHT  150s Toco NA   Assessment/Plan: 13 week, incompetent cervix, for prophylactic McDonald's cerclage.  Risks/complications of surgery reviewed incl infection, bleeding, damage to internal organs including bladder, bowels, ureters, blood vessels, other risks from anesthesia, VTE and delayed complications of any surgery, complications in future surgery reviewed. Risk of failure and need for another cerclage rare but possible dw pt.    Knock JONELLE Barbette 11/26/2023, 6:41 AM  Pt seen and assessed, agree with H&P and plan   --V.Konstantin Lehnen MD

## 2024-06-02 ENCOUNTER — Other Ambulatory Visit: Payer: Self-pay | Admitting: Obstetrics & Gynecology

## 2024-06-03 ENCOUNTER — Inpatient Hospital Stay (HOSPITAL_COMMUNITY): Admitting: Anesthesiology

## 2024-06-03 ENCOUNTER — Other Ambulatory Visit: Payer: Self-pay

## 2024-06-03 ENCOUNTER — Inpatient Hospital Stay (HOSPITAL_COMMUNITY)
Admission: AD | Admit: 2024-06-03 | Discharge: 2024-06-04 | DRG: 806 | Disposition: A | Discharge status: Home / Self Care | Attending: Obstetrics | Admitting: Obstetrics

## 2024-06-03 ENCOUNTER — Encounter (HOSPITAL_COMMUNITY): Payer: Self-pay | Admitting: Obstetrics & Gynecology

## 2024-06-03 DIAGNOSIS — O26893 Other specified pregnancy related conditions, third trimester: Secondary | ICD-10-CM | POA: Diagnosis present

## 2024-06-03 DIAGNOSIS — D62 Acute posthemorrhagic anemia: Secondary | ICD-10-CM | POA: Diagnosis not present

## 2024-06-03 DIAGNOSIS — Z3A40 40 weeks gestation of pregnancy: Secondary | ICD-10-CM

## 2024-06-03 DIAGNOSIS — O9081 Anemia of the puerperium: Secondary | ICD-10-CM | POA: Diagnosis not present

## 2024-06-03 LAB — TYPE AND SCREEN
ABO/RH(D): O POS
Antibody Screen: NEGATIVE

## 2024-06-03 LAB — CBC
HCT: 36.9 % (ref 36.0–46.0)
HCT: 34.3 % — ABNORMAL LOW (ref 36.0–46.0)
Hemoglobin: 12 g/dL (ref 12.0–15.0)
Hemoglobin: 11.3 g/dL — ABNORMAL LOW (ref 12.0–15.0)
MCH: 28 pg (ref 26.0–34.0)
MCH: 28.2 pg (ref 26.0–34.0)
MCHC: 32.5 g/dL (ref 30.0–36.0)
MCHC: 32.9 g/dL (ref 30.0–36.0)
MCV: 86 fL (ref 80.0–100.0)
MCV: 85.5 fL (ref 80.0–100.0)
Platelets: 205 K/uL (ref 150–400)
Platelets: 194 K/uL (ref 150–400)
RBC: 4.29 MIL/uL (ref 3.87–5.11)
RBC: 4.01 MIL/uL (ref 3.87–5.11)
RDW: 14.3 % (ref 11.5–15.5)
RDW: 14.2 % (ref 11.5–15.5)
WBC: 13.6 K/uL — ABNORMAL HIGH (ref 4.0–10.5)
WBC: 13.4 K/uL — ABNORMAL HIGH (ref 4.0–10.5)
nRBC: 0 % (ref 0.0–0.2)
nRBC: 0 % (ref 0.0–0.2)

## 2024-06-03 LAB — SYPHILIS: RPR W/REFLEX TO RPR TITER AND TREPONEMAL ANTIBODIES, TRADITIONAL SCREENING AND DIAGNOSIS ALGORITHM: RPR Ser Ql: NONREACTIVE

## 2024-06-03 LAB — PROTIME-INR
INR: 1 (ref 0.8–1.2)
Prothrombin Time: 14.2 s (ref 11.4–15.2)

## 2024-06-03 LAB — FIBRINOGEN: Fibrinogen: 457 mg/dL (ref 210–475)

## 2024-06-03 LAB — POSTPARTUM HEMORRHAGE (BB NOTIFICATION)

## 2024-06-03 LAB — APTT: aPTT: 27 s (ref 24–36)

## 2024-06-03 MED ORDER — LIDOCAINE HCL (PF) 1 % IJ SOLN
INTRAMUSCULAR | Status: DC | PRN
  Administered 2024-06-03: 11 mL via EPIDURAL

## 2024-06-03 MED ORDER — BENZOCAINE-MENTHOL 20-0.5 % EX AERO
1.0000 | INHALATION_SPRAY | CUTANEOUS | Status: DC | PRN
  Administered 2024-06-04: 1 via TOPICAL
  Filled 2024-06-03: qty 56

## 2024-06-03 MED ORDER — OXYCODONE HCL 5 MG PO TABS: 10.0000 mg | ORAL_TABLET | ORAL | Status: DC | PRN

## 2024-06-03 MED ORDER — FENTANYL-BUPIVACAINE-NACL 0.5-0.125-0.9 MG/250ML-% EP SOLN
12.0000 mL/h | EPIDURAL | Status: DC | PRN
  Administered 2024-06-03: 12 mL/h via EPIDURAL

## 2024-06-03 MED ORDER — OXYTOCIN-SODIUM CHLORIDE 30-0.9 UT/500ML-% IV SOLN
2.5000 [IU]/h | INTRAVENOUS | Status: DC
  Filled 2024-06-03: qty 500

## 2024-06-03 MED ORDER — LIDOCAINE HCL (PF) 1 % IJ SOLN: 30.0000 mL | INTRAMUSCULAR | Status: DC | PRN

## 2024-06-03 MED ORDER — TETANUS-DIPHTH-ACELL PERTUSSIS 5-2-15.5 LF-MCG/0.5 IM SUSP: 0.5000 mL | Freq: Once | INTRAMUSCULAR | Status: DC

## 2024-06-03 MED ORDER — OXYTOCIN BOLUS FROM INFUSION
333.0000 mL | Freq: Once | INTRAVENOUS | Status: AC
  Administered 2024-06-03: 333 mL via INTRAVENOUS

## 2024-06-03 MED ORDER — PHENYLEPHRINE 80 MCG/ML (10ML) SYRINGE FOR IV PUSH (FOR BLOOD PRESSURE SUPPORT): 80.0000 ug | PREFILLED_SYRINGE | INTRAVENOUS | Status: DC | PRN

## 2024-06-03 MED ORDER — ZOLPIDEM TARTRATE 5 MG PO TABS: 5.0000 mg | ORAL_TABLET | Freq: Every evening | ORAL | Status: DC | PRN

## 2024-06-03 MED ORDER — ONDANSETRON HCL 4 MG/2ML IJ SOLN: 4.0000 mg | INTRAMUSCULAR | Status: DC | PRN

## 2024-06-03 MED ORDER — METHYLERGONOVINE MALEATE 0.2 MG/ML IJ SOLN: 0.2000 mg | INTRAMUSCULAR | Status: DC | PRN

## 2024-06-03 MED ORDER — METHYLERGONOVINE MALEATE 0.2 MG PO TABS: 0.2000 mg | ORAL_TABLET | ORAL | Status: DC | PRN

## 2024-06-03 MED ORDER — SIMETHICONE 80 MG PO CHEW: 80.0000 mg | CHEWABLE_TABLET | ORAL | Status: DC | PRN

## 2024-06-03 MED ORDER — MISOPROSTOL 200 MCG PO TABS
800.0000 ug | ORAL_TABLET | Freq: Once | ORAL | Status: AC
  Administered 2024-06-03: 800 ug via RECTAL

## 2024-06-03 MED ORDER — ACETAMINOPHEN 325 MG PO TABS: 650.0000 mg | ORAL_TABLET | ORAL | Status: DC | PRN

## 2024-06-03 MED ORDER — TRANEXAMIC ACID-NACL 1000-0.7 MG/100ML-% IV SOLN
INTRAVENOUS | Status: AC
  Filled 2024-06-03: qty 100

## 2024-06-03 MED ORDER — PHENYLEPHRINE 80 MCG/ML (10ML) SYRINGE FOR IV PUSH (FOR BLOOD PRESSURE SUPPORT)
80.0000 ug | PREFILLED_SYRINGE | INTRAVENOUS | Status: DC | PRN
  Filled 2024-06-03: qty 10

## 2024-06-03 MED ORDER — OXYCODONE-ACETAMINOPHEN 5-325 MG PO TABS: 2.0000 | ORAL_TABLET | ORAL | Status: DC | PRN

## 2024-06-03 MED ORDER — LACTATED RINGERS IV SOLN: INTRAVENOUS | Status: DC

## 2024-06-03 MED ORDER — SENNOSIDES-DOCUSATE SODIUM 8.6-50 MG PO TABS
2.0000 | ORAL_TABLET | ORAL | Status: DC
  Administered 2024-06-03 – 2024-06-04 (×2): 2 via ORAL
  Filled 2024-06-03 (×2): qty 2

## 2024-06-03 MED ORDER — EPHEDRINE 5 MG/ML INJ: 10.0000 mg | INTRAVENOUS | Status: DC | PRN

## 2024-06-03 MED ORDER — WITCH HAZEL-GLYCERIN EX PADS: 1.0000 | MEDICATED_PAD | CUTANEOUS | Status: DC | PRN

## 2024-06-03 MED ORDER — DIPHENHYDRAMINE HCL 50 MG/ML IJ SOLN: 12.5000 mg | INTRAMUSCULAR | Status: DC | PRN

## 2024-06-03 MED ORDER — COCONUT OIL OIL: 1.0000 | TOPICAL_OIL | Status: DC | PRN

## 2024-06-03 MED ORDER — FLEET ENEMA RE ENEM: 1.0000 | ENEMA | RECTAL | Status: DC | PRN

## 2024-06-03 MED ORDER — LACTATED RINGERS IV SOLN
500.0000 mL | Freq: Once | INTRAVENOUS | Status: AC
  Administered 2024-06-03: 500 mL via INTRAVENOUS

## 2024-06-03 MED ORDER — METHYLERGONOVINE MALEATE 0.2 MG/ML IJ SOLN: 0.2000 mg | Freq: Once | INTRAMUSCULAR | Status: DC

## 2024-06-03 MED ORDER — ONDANSETRON HCL 4 MG/2ML IJ SOLN: 4.0000 mg | Freq: Four times a day (QID) | INTRAMUSCULAR | Status: DC | PRN

## 2024-06-03 MED ORDER — SOD CITRATE-CITRIC ACID 500-334 MG/5ML PO SOLN: 30.0000 mL | ORAL | Status: DC | PRN

## 2024-06-03 MED ORDER — DIPHENHYDRAMINE HCL 25 MG PO CAPS: 25.0000 mg | ORAL_CAPSULE | Freq: Four times a day (QID) | ORAL | Status: DC | PRN

## 2024-06-03 MED ORDER — METHYLERGONOVINE MALEATE 0.2 MG/ML IJ SOLN
INTRAMUSCULAR | Status: AC
  Administered 2024-06-03: 0.2 mg
  Filled 2024-06-03: qty 1

## 2024-06-03 MED ORDER — ONDANSETRON HCL 4 MG PO TABS: 4.0000 mg | ORAL_TABLET | ORAL | Status: DC | PRN

## 2024-06-03 MED ORDER — TRANEXAMIC ACID-NACL 1000-0.7 MG/100ML-% IV SOLN
1000.0000 mg | INTRAVENOUS | Status: AC
  Administered 2024-06-03: 1000 mg via INTRAVENOUS

## 2024-06-03 MED ORDER — OXYCODONE-ACETAMINOPHEN 5-325 MG PO TABS: 1.0000 | ORAL_TABLET | ORAL | Status: DC | PRN

## 2024-06-03 MED ORDER — MISOPROSTOL 200 MCG PO TABS
ORAL_TABLET | ORAL | Status: AC
  Filled 2024-06-03: qty 5

## 2024-06-03 MED ORDER — PRENATAL MULTIVITAMIN CH
1.0000 | ORAL_TABLET | Freq: Every day | ORAL | Status: DC
  Administered 2024-06-03 – 2024-06-04 (×2): 1 via ORAL
  Filled 2024-06-03 (×2): qty 1

## 2024-06-03 MED ORDER — FENTANYL-BUPIVACAINE-NACL 0.5-0.125-0.9 MG/250ML-% EP SOLN
EPIDURAL | Status: AC
  Filled 2024-06-03: qty 250

## 2024-06-03 MED ORDER — IBUPROFEN 600 MG PO TABS
600.0000 mg | ORAL_TABLET | Freq: Four times a day (QID) | ORAL | Status: DC
  Administered 2024-06-03 – 2024-06-04 (×5): 600 mg via ORAL
  Filled 2024-06-03 (×5): qty 1

## 2024-06-03 MED ORDER — DIBUCAINE (PERIANAL) 1 % EX OINT: 1.0000 | TOPICAL_OINTMENT | CUTANEOUS | Status: DC | PRN

## 2024-06-03 MED ORDER — OXYCODONE HCL 5 MG PO TABS: 5.0000 mg | ORAL_TABLET | ORAL | Status: DC | PRN

## 2024-06-03 MED ORDER — LACTATED RINGERS IV SOLN: 500.0000 mL | INTRAVENOUS | Status: DC | PRN

## 2024-06-03 NOTE — Anesthesia Procedure Notes (Signed)
 Epidural Patient location during procedure: OB Start time: 06/03/2024 6:37 AM End time: 06/03/2024 6:50 AM  Staffing Anesthesiologist: Cleotilde Butler Dade, MD Performed: anesthesiologist   Preanesthetic Checklist Completed: patient identified, IV checked, site marked, risks and benefits discussed, surgical consent, monitors and equipment checked, pre-op evaluation and timeout performed  Epidural Patient position: sitting Prep: ChloraPrep Patient monitoring: heart rate, cardiac monitor, continuous pulse ox and blood pressure Approach: midline Location: L2-L3 Injection technique: LOR saline  Needle:  Needle type: Tuohy  Needle gauge: 17 G Needle length: 9 cm Needle insertion depth: 6 cm Catheter type: closed end flexible Catheter size: 20 Guage Catheter at skin depth: 10 cm Test dose: negative  Assessment Events: blood not aspirated, injection not painful, no injection resistance, no paresthesia and negative IV test  Additional Notes Reason for block:procedure for pain

## 2024-06-03 NOTE — Anesthesia Preprocedure Evaluation (Addendum)
Anesthesia Evaluation  Patient identified by MRN, date of birth, ID band Patient awake    Reviewed: Allergy & Precautions, H&P , NPO status , Patient's Chart, lab work & pertinent test results  History of Anesthesia Complications Negative for: history of anesthetic complications  Airway Mallampati: II  TM Distance: >3 FB Neck ROM: full    Dental no notable dental hx.    Pulmonary neg pulmonary ROS   Pulmonary exam normal        Cardiovascular negative cardio ROS Normal cardiovascular exam Rhythm:regular Rate:Normal     Neuro/Psych negative neurological ROS  negative psych ROS   GI/Hepatic negative GI ROS, Neg liver ROS,,,  Endo/Other  negative endocrine ROS    Renal/GU negative Renal ROS  negative genitourinary   Musculoskeletal   Abdominal   Peds  Hematology negative hematology ROS (+)   Anesthesia Other Findings   Reproductive/Obstetrics (+) Pregnancy                              Anesthesia Physical Anesthesia Plan  ASA: II  Anesthesia Plan: Epidural   Post-op Pain Management:    Induction:   PONV Risk Score and Plan:   Airway Management Planned:   Additional Equipment:   Intra-op Plan:   Post-operative Plan:   Informed Consent: I have reviewed the patients History and Physical, chart, labs and discussed the procedure including the risks, benefits and alternatives for the proposed anesthesia with the patient or authorized representative who has indicated his/her understanding and acceptance.       Plan Discussed with:   Anesthesia Plan Comments:          Anesthesia Quick Evaluation

## 2024-06-03 NOTE — H&P (Signed)
 Bridget Miller is a 34 y.o. G3P1011 at [redacted]w[redacted]d presenting for active labor. Pt notes worsening contractions through the night. Good fetal movement, No vaginal bleeding, not leaking fluid.  [facility] first [facility]. - Dated by early ultrasound not consistent with dates - History of cervical incompetence with rescue cerclage placed at 22 weeks and eventual 38-week delivery and her last pregnancy.  Prophylactic cerclage placed at 13 weeks this pregnancy and removed at 36 weeks.  Cervical funneling seen above the cerclage and patient was started on vaginal Prometrium at 19 weeks - Questionable early thickened nuchal translucency at 10 weeks but normal panorama  Prenatal Transfer Tool  Maternal Diabetes: No Genetic Screening: Normal Maternal Ultrasounds/Referrals: Normal Fetal Ultrasounds or other Referrals:  None Maternal Substance Abuse:  No Significant Maternal Medications:  None Significant Maternal Lab Results: Group B Strep negative Number of Prenatal Visits:greater than 3 verified prenatal visits Maternal Vaccinations:RSV: Given during pregnancy >/=14 days ago, TDap, and Flu Other Comments:  None       OB History     Gravida  3   Para  1   Term  1   Preterm      AB  1   Living  1      SAB  1   IAB      Ectopic      Multiple  0   Live Births  1          Past Medical History:  Diagnosis Date   Incompetent cervix during second trimester, antepartum 06/13/2020   Incompetent cervix in pregnancy, antepartum, first trimester 11/26/2023   Medical history non-contributory    Past Surgical History:  Procedure Laterality Date   CERVICAL CERCLAGE N/A 06/13/2020   Procedure: Emergency McDonald CERVICAL CERCLAGE;  Surgeon: Barbette Knock, MD;  Location: [facility] LD ORS;  Service: Gynecology;  Laterality: N/A;  EDD: 10/12/20   CERVICAL CERCLAGE N/A 11/26/2023   Procedure: CERCLAGE, CERVIX, VAGINAL APPROACH;  Surgeon: Barbette Knock, MD;  Location:  [facility] LD ORS;  Service: Gynecology;  Laterality: N/A;   NO PAST SURGERIES     Family History: family history is not on file. Social History:  reports that she has never smoked. She has never used smokeless tobacco. She reports that she does not drink alcohol and does not use drugs.  Review of Systems - Negative except painful contractions   Dilation: 7.5 Effacement (%): 90 Station: 0 Exam by:: Donnal Stai, RN Blood pressure 112/71, pulse 78, unknown if currently breastfeeding.  Physical Exam:  Gen: well appearing, no distress, uncomfortable with contractions  Back: no CVAT Abd: gravid, NT, no RUQ pain LE: Trace edema, equal bilaterally, non-tender Toco: Every 2 to 3 FH: baseline 115, accelerations present, no deceleratons, 10 beat variability  Prenatal labs: ABO, Rh: --/--/O POS (06/25 0723) Antibody: NEG (06/25 0723) Rubella:  Immune RPR: NON REACTIVE (06/25 0726)  HBsAg:   Negative HIV:   Negative GBS:   Negative 1 hr Glucola 60  Genetic screening normal panorama Anatomy US  normal  CBC    Component Value Date/Time   WBC 13.6 (H) 06/03/2024 0607   RBC 4.29 06/03/2024 0607   HGB 12.0 06/03/2024 0607   HCT 36.9 06/03/2024 0607   PLT 205 06/03/2024 0607   MCV 86.0 06/03/2024 0607   MCH 28.0 06/03/2024 0607   MCHC 32.5 06/03/2024 0607   RDW 14.3 06/03/2024 0607     Assessment/Plan: 33 y.o. G3P1011 at [redacted]w[redacted]d Active labor. Prep for delivery Reassuring  fetal status Plan epidural now GBS negative  Burnard DELENA Bowers 06/03/2024, 6:34 AM

## 2024-06-03 NOTE — MAU Note (Signed)
 MAU Labor Triage Note:  .Bridget Miller is a 34 y.o. at [redacted]w[redacted]d here in MAU reporting:  Contractions every: 5 minutes Onset of ctx: today    ROM: denies watery LOF Vaginal Bleeding: denies Last SVE: 4 cm in the office Labor Pain Management Plan: Planning epidural  GBS: Negative  Fetal Movement: Reports positive FM   Lab orders placed from triage: MAU Labor Eval OB Office: [facility] [employer]

## 2024-06-03 NOTE — L&D Delivery Note (Signed)
 Delivery Note At 8:55 AM a viable female was delivered via Vaginal, Spontaneous (Presentation: Left Occiput Anterior).  APGAR: 9, 9; weight 7 lb 4.8 oz (3310 g).   Placenta status: Spontaneous, Intact.  Cord: 3 vessels with the following complications: nuchal x 1 reduced at perineumNone.  Cord pH: not indicated as good cry/ tone.   Just prior to delivery, brisk red bleeding was noted, no vaginal lacerations notes. FHT to 70's for about 2-3 min and pt asked to push w/o contraction. Nuchal cord noted and spontaneous cry with delivery.   Long L sulcal tear noted 3/4 of the way up the vagina.   After placenta brisk uterine bleeding noted. Fundal palpated firm but bleeding continued. TXA given. Foley replaced for about 200 cc urine. Methergine given and code hemorrhage called. 800 mcg rectal cytotec given, 2nd IV placed and labs drawn. Bleeding slowed.   Anesthesia: Epidural Episiotomy: None Lacerations: 2nd degree;Sulcus Suture Repair: Large L lateral sulcal repaired with locked 3-0 vicryl. R sulcal repaired with 3-0 vicryl. 2nd degree laceration repaired with 3-0 vicryl.  Est. Blood Loss (mL): 1330  Mom to postpartum.  Baby to Couplet care / Skin to Skin.  Burnard DELENA Bowers 06/03/2024, 10:17 AM

## 2024-06-04 LAB — CBC
HCT: 26.2 % — ABNORMAL LOW (ref 36.0–46.0)
Hemoglobin: 8.8 g/dL — ABNORMAL LOW (ref 12.0–15.0)
MCH: 28.7 pg (ref 26.0–34.0)
MCHC: 33.6 g/dL (ref 30.0–36.0)
MCV: 85.3 fL (ref 80.0–100.0)
Platelets: 153 K/uL (ref 150–400)
RBC: 3.07 MIL/uL — ABNORMAL LOW (ref 3.87–5.11)
RDW: 14.5 % (ref 11.5–15.5)
WBC: 14.8 K/uL — ABNORMAL HIGH (ref 4.0–10.5)
nRBC: 0 % (ref 0.0–0.2)

## 2024-06-04 MED ORDER — SODIUM CHLORIDE 0.9 % IV SOLN
500.0000 mg | Freq: Once | INTRAVENOUS | Status: DC
  Filled 2024-06-04: qty 25

## 2024-06-04 NOTE — Discharge Summary (Signed)
 "    Postpartum Discharge Summary  Date of Service updated 06/04/24     Patient Name: Bridget Miller DOB: 07/16/1990 MRN: [id number]  Date of admission: 06/03/2024 Delivery date:06/03/2024 Delivering provider: KANDYCE SOR Date of discharge: 06/04/2024  Admitting diagnosis: Normal labor and delivery [O80] Intrauterine pregnancy: [redacted]w[redacted]d     Secondary diagnosis:  Principal Problem:   Normal labor and delivery  Additional problems: H/o cervical incompetence, cerclage placed at 13 weeks, removed at 36.    Discharge diagnosis: Term Pregnancy Delivered                                              Post partum procedures:Venofer Augmentation: N/A Complications: None  Hospital course: Onset of Labor With Vaginal Delivery      34 y.o. yo [id number] at [redacted]w[redacted]d was admitted in Active Labor on 06/03/2024. Labor course was complicated bysulcal laceration and PPH  Membrane Rupture Time/Date: 8:44 AM,06/03/2024  Delivery Method:Vaginal, Spontaneous Operative Delivery:N/A Episiotomy: None Lacerations:  2nd degree;Sulcus Patient had a postpartum course complicated by anemia.  She is ambulating, tolerating a regular diet, passing flatus, and urinating well. Patient is discharged home in stable condition on 06/04/2024.  Newborn Data: Birth date:06/03/2024 Birth time:8:55 AM Gender:Female Living status:Living Apgars:9 ,9  Weight:3310 g  Magnesium  Sulfate received: No BMZ received: No Rhophylac:No MMR:No T-DaP:Given prenatally Flu: Yes RSV Vaccine received: Yes Transfusion:No Immunizations administered: There is no immunization history for the selected administration types on file for this patient.  Physical exam  Vitals:   06/03/24 1600 06/03/24 2130 06/04/24 0130 06/04/24 0521  BP: 114/60 111/71 111/75 (!) 106/90  Pulse: 77 81 93 85  Resp: 16 20 20 20   Temp: 98.1 F (36.7 C) 98.6 F (37 C) 98.9 F (37.2 C) 98.6 F (37 C)  TempSrc: Oral Oral  Oral  SpO2: 100% 99% 99% 100%  Weight:       Height:       Labs: Lab Results  Component Value Date   WBC 14.8 (H) 06/04/2024   HGB 8.8 (L) 06/04/2024   HCT 26.2 (L) 06/04/2024   MCV 85.3 06/04/2024   PLT 153 06/04/2024       No data to display         Edinburgh Score:    10/05/2020    3:15 AM  Edinburgh Postnatal Depression Scale Screening Tool  I have been able to laugh and see the funny side of things. 0   I have looked forward with enjoyment to things. 0   I have blamed myself unnecessarily when things went wrong. 1   I have been anxious or worried for no good reason. 0   I have felt scared or panicky for no good reason. 0   Things have been getting on top of me. 0   I have been so unhappy that I have had difficulty sleeping. 0   I have felt sad or miserable. 0   I have been so unhappy that I have been crying. 0   The thought of harming myself has occurred to me. 0   Edinburgh Postnatal Depression Scale Total 1      Data saved with a previous flowsheet row definition      After visit meds:  Allergies as of 06/04/2024   No Known Allergies      Medication List     TAKE these  medications    acetaminophen  325 MG tablet Commonly known as: Tylenol  Take 2 tablets (650 mg total) by mouth every 6 (six) hours as needed.   PRENATAL PO Take 1 tablet by mouth every evening.         Discharge home in stable condition Infant Feeding: Breast Infant Disposition:home with mother Discharge instruction: per After Visit Summary and Postpartum booklet. Activity: Advance as tolerated. Pelvic rest for 6 weeks.  Diet: routine diet Anticipated Birth Control: Unsure Postpartum Appointment:6 weeks Additional Postpartum F/U: CBC for maternal anemia Future Appointments:No future appointments. Follow up Visit:  Follow-up Information     Barbette Knock, MD Follow up.   Specialty: Obstetrics and Gynecology Contact information: [address] [place] [address] [phone]                      06/04/2024 Sherrilee KANDICE Both, CNM   "

## 2024-06-04 NOTE — Lactation Note (Signed)
 This note was copied from a baby's chart. Lactation Consultation Note  Patient Name: Bridget Miller Unijb'd Date: 06/04/2024 Age:33 hours Reason for consult: Initial assessment;Term P2, MOB informed LC infant last ate at 5 pm ( 6 hours ago), LC discussed MOB call for latch assistance if infant does not latch or hand express and give infant back colostrum by spoon. LC discussed infant's small tummy size and the infant should not go past 3 hours without feeding. LC discussed feed infant by cues, on demand, 8-12 times within 24 hours, skin to skin. MOB undressed infant used pillow support and latched infant on her left breast using the football hold position, infant sustained her latch with depth and was still breastfeeding after 17 minutes when LC left the room. MOB will call for further latch assistance as needed.  LC discussed importance of maternal rest, meals and hydration. MOB was  made aware of O/P services, breastfeeding support groups, community resources, and our phone # for post-discharge questions.    Maternal Data Has patient been taught Hand Expression?: Yes Does the patient have breastfeeding experience prior to this delivery?: Yes How long did the patient breastfeed?: Per MOB, she had latch difficulties with her 1st child so she pumped only for 6 months.  Feeding Mother's Current Feeding Choice: Breast Milk  LATCH Score Latch: Grasps breast easily, tongue down, lips flanged, rhythmical sucking.  Audible Swallowing: A few with stimulation  Type of Nipple: Everted at rest and after stimulation  Comfort (Breast/Nipple): Soft / non-tender  Hold (Positioning): Assistance needed to correctly position infant at breast and maintain latch.  LATCH Score: 8   Lactation Tools Discussed/Used    Interventions Interventions: Breast feeding basics reviewed;Assisted with latch;Skin to skin;Support pillows;Position options;Adjust position;Breast compression;Hand  express;DEBP;Education;Guidelines for Milk Supply and Pumping Schedule Handout;LC Services brochure;CDC milk storage guidelines;CDC Guidelines for Breast Pump Cleaning  Discharge Pump: DEBP;Personal (Per MOB, she has Medela DEBP at home.)  Consult Status Consult Status: Follow-up Date: 06/04/24 Follow-up type: In-patient    Grayce LULLA Batter 06/04/2024, 12:07 AM

## 2024-06-04 NOTE — Anesthesia Postprocedure Evaluation (Signed)
"   Anesthesia Post Note  Patient: Bridget Miller  Procedure(s) Performed: AN AD HOC LABOR EPIDURAL     Patient location during evaluation: Mother Baby Anesthesia Type: Epidural Level of consciousness: awake and alert and oriented Pain management: satisfactory to patient Vital Signs Assessment: post-procedure vital signs reviewed and stable Respiratory status: respiratory function stable Cardiovascular status: stable Postop Assessment: no headache, no backache, epidural receding, patient able to bend at knees, no signs of nausea or vomiting, adequate PO intake and able to ambulate Anesthetic complications: no   No notable events documented.  Last Vitals:  Vitals:   06/04/24 0130 06/04/24 0521  BP: 111/75 (!) 106/90  Pulse: 93 85  Resp: 20 20  Temp: 37.2 C 37 C  SpO2: 99% 100%    Last Pain:  Vitals:   06/04/24 0730  TempSrc:   PainSc: 0-No pain   Pain Goal:                   Donavon Kimrey      "

## 2024-06-04 NOTE — Progress Notes (Signed)
" ° °  PPD #1 S/P NSVD  Live born female  Birth Weight: 7 lb 4.8 oz (3310 g) APGAR: 9, 9  Newborn Delivery   Birth date/time: 06/03/2023 08:55:00 Delivery type: Vaginal, Spontaneous     Delivering provider: KANDYCE SOR  Lacerations: 2nd degree;Sulcus  Feeding: breast  Pain control at delivery: Epidural  S:  Reports feeling well, tired today.              Tolerating PO/No nausea or vomiting             Bleeding is light             Pain controlled with acetaminophen  and ibuprofen  (OTC)             Up ad lib/ambulatory/voiding without difficulties   O:  A & O x 3, in no apparent distress              VS:  Vitals:   06/03/24 1600 06/03/24 2130 06/04/24 0130 06/04/24 0521  BP: 114/60 111/71 111/75 (!) 106/90  Pulse: 77 81 93 85  Resp: 16 20 20 20   Temp: 98.1 F (36.7 C) 98.6 F (37 C) 98.9 F (37.2 C) 98.6 F (37 C)  TempSrc: Oral Oral  Oral  SpO2: 100% 99% 99% 100%  Weight:      Height:        LABS:  Recent Labs    06/03/24 0915 06/04/24 0518  WBC 13.4* 14.8*  HGB 11.3* 8.8*  HCT 34.3* 26.2*  PLT 194 153    Blood type: --/--/O POS (01/01 9286)  Rubella: Immune (06/18 0000)   I&O: I/O last 3 completed shifts: In: 88.4 [I.V.:52.8; Other:35.6] Out: 4708 [Urine:3275; Blood:1433]          No intake/output data recorded.  Gen: AAO x 3, NAD Abdomen: soft, non-tender, non-distended Fundus: firm, non-tender, U-1 Perineum: appropriately tender Lochia: minimal Extremities: No edema, no calf pain or tenderness   A/P:  PPD # 1 33 y.o., [id number]   Principal Problem:   Normal labor and delivery    Doing well - stable status s/p NSVD and PPH with large sulcal laceration.   Anemia of pregnancy upon admission complicated by pptm blood loss. Hgb 8.8, IV venofer today.   Encouraged rest, bonding and lactation support as desired.   Routine post partum orders  Anticipate discharge later today.    Sherrilee KANDICE Both, MSN, CNM 06/04/2024, 8:38 AM     "

## 2024-06-08 ENCOUNTER — Inpatient Hospital Stay (HOSPITAL_COMMUNITY)

## 2024-06-12 ENCOUNTER — Telehealth (HOSPITAL_COMMUNITY): Payer: Self-pay | Admitting: *Deleted

## 2024-06-12 NOTE — Telephone Encounter (Signed)
 Attempted hospital discharge follow-up call. Left message for patient to return RN call with any questions or concerns. Allean IVAR Carton, RN, 06/12/24, [phone]
